# Patient Record
Sex: Female | Born: 1985 | Hispanic: Yes | Marital: Single | Smoking: Never smoker
Health system: Southern US, Community
[De-identification: ages and names within clinical notes are randomized; demographics above are authoritative.]

## PROBLEM LIST (undated history)

## (undated) DIAGNOSIS — E669 Obesity, unspecified: Secondary | ICD-10-CM

## (undated) DIAGNOSIS — E282 Polycystic ovarian syndrome: Secondary | ICD-10-CM

## (undated) DIAGNOSIS — Z98891 History of uterine scar from previous surgery: Secondary | ICD-10-CM

## (undated) DIAGNOSIS — T7840XA Allergy, unspecified, initial encounter: Secondary | ICD-10-CM

## (undated) DIAGNOSIS — IMO0002 Reserved for concepts with insufficient information to code with codable children: Secondary | ICD-10-CM

## (undated) DIAGNOSIS — F419 Anxiety disorder, unspecified: Secondary | ICD-10-CM

## (undated) DIAGNOSIS — R87619 Unspecified abnormal cytological findings in specimens from cervix uteri: Secondary | ICD-10-CM

## (undated) DIAGNOSIS — C801 Malignant (primary) neoplasm, unspecified: Secondary | ICD-10-CM

## (undated) DIAGNOSIS — J45909 Unspecified asthma, uncomplicated: Secondary | ICD-10-CM

## (undated) DIAGNOSIS — B977 Papillomavirus as the cause of diseases classified elsewhere: Secondary | ICD-10-CM

## (undated) HISTORY — DX: Unspecified abnormal cytological findings in specimens from cervix uteri: R87.619

## (undated) HISTORY — DX: Papillomavirus as the cause of diseases classified elsewhere: B97.7

## (undated) HISTORY — DX: Obesity, unspecified: E66.9

## (undated) HISTORY — DX: Polycystic ovarian syndrome: E28.2

## (undated) HISTORY — DX: Malignant (primary) neoplasm, unspecified: C80.1

## (undated) HISTORY — DX: Anxiety disorder, unspecified: F41.9

## (undated) HISTORY — DX: Allergy, unspecified, initial encounter: T78.40XA

## (undated) HISTORY — DX: Unspecified asthma, uncomplicated: J45.909

## (undated) HISTORY — DX: Reserved for concepts with insufficient information to code with codable children: IMO0002

---
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
## 2003-03-21 ENCOUNTER — Other Ambulatory Visit: Admission: RE | Admit: 2003-03-21 | Discharge: 2003-03-21 | Payer: Self-pay | Admitting: Obstetrics and Gynecology

## 2004-01-21 ENCOUNTER — Ambulatory Visit (HOSPITAL_COMMUNITY): Admission: RE | Admit: 2004-01-21 | Discharge: 2004-01-21 | Payer: Self-pay | Admitting: Unknown Physician Specialty

## 2004-07-16 ENCOUNTER — Emergency Department (HOSPITAL_COMMUNITY): Admission: EM | Admit: 2004-07-16 | Discharge: 2004-07-16 | Payer: Self-pay | Admitting: Emergency Medicine

## 2004-08-02 ENCOUNTER — Encounter: Payer: Self-pay | Admitting: Pulmonary Disease

## 2004-09-06 ENCOUNTER — Encounter: Payer: Self-pay | Admitting: Pulmonary Disease

## 2004-11-23 ENCOUNTER — Encounter: Payer: Self-pay | Admitting: Pulmonary Disease

## 2004-11-29 ENCOUNTER — Encounter: Payer: Self-pay | Admitting: Pulmonary Disease
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
## 2008-05-24 ENCOUNTER — Encounter: Payer: Self-pay | Admitting: Pulmonary Disease

## 2009-02-23 ENCOUNTER — Ambulatory Visit: Payer: Self-pay | Admitting: Pulmonary Disease

## 2009-02-23 DIAGNOSIS — J45909 Unspecified asthma, uncomplicated: Secondary | ICD-10-CM | POA: Insufficient documentation

## 2009-02-23 DIAGNOSIS — J309 Allergic rhinitis, unspecified: Secondary | ICD-10-CM | POA: Insufficient documentation

## 2009-02-23 DIAGNOSIS — R519 Headache, unspecified: Secondary | ICD-10-CM | POA: Insufficient documentation

## 2009-02-23 DIAGNOSIS — R51 Headache: Secondary | ICD-10-CM

## 2009-03-22 ENCOUNTER — Ambulatory Visit: Payer: Self-pay | Admitting: Pulmonary Disease

## 2009-03-24 ENCOUNTER — Ambulatory Visit: Payer: Self-pay | Admitting: Cardiovascular Disease

## 2009-03-29 ENCOUNTER — Encounter: Payer: Self-pay | Admitting: Pulmonary Disease

## 2009-03-30 LAB — CONVERTED CEMR LAB: IgE (Immunoglobulin E), Serum: 187.8 intl units/mL — ABNORMAL HIGH (ref 0.0–180.0)

## 2009-04-03 ENCOUNTER — Encounter: Payer: Self-pay | Admitting: Pulmonary Disease

## 2009-04-13 ENCOUNTER — Ambulatory Visit: Payer: Self-pay | Admitting: Pulmonary Disease

## 2009-04-22 HISTORY — PX: DILATION AND CURETTAGE OF UTERUS: SHX78

## 2011-04-23 DIAGNOSIS — C801 Malignant (primary) neoplasm, unspecified: Secondary | ICD-10-CM

## 2011-04-23 HISTORY — DX: Malignant (primary) neoplasm, unspecified: C80.1

## 2011-10-16 ENCOUNTER — Ambulatory Visit (INDEPENDENT_AMBULATORY_CARE_PROVIDER_SITE_OTHER): Payer: BC Managed Care – PPO | Admitting: Physician Assistant

## 2011-10-16 VITALS — BP 100/70 | HR 81 | Temp 98.6°F | Resp 16 | Ht 68.5 in | Wt 236.6 lb

## 2011-10-16 DIAGNOSIS — N898 Other specified noninflammatory disorders of vagina: Secondary | ICD-10-CM

## 2011-10-16 DIAGNOSIS — N926 Irregular menstruation, unspecified: Secondary | ICD-10-CM

## 2011-10-16 DIAGNOSIS — R300 Dysuria: Secondary | ICD-10-CM

## 2011-10-16 LAB — POCT UA - MICROSCOPIC ONLY
Casts, Ur, LPF, POC: NEGATIVE
Crystals, Ur, HPF, POC: NEGATIVE
Mucus, UA: POSITIVE
Yeast, UA: NEGATIVE

## 2011-10-16 LAB — POCT URINALYSIS DIPSTICK
Bilirubin, UA: NEGATIVE
Blood, UA: NEGATIVE
Glucose, UA: NEGATIVE
Ketones, UA: NEGATIVE
Leukocytes, UA: NEGATIVE
Nitrite, UA: NEGATIVE
Protein, UA: NEGATIVE
Spec Grav, UA: 1.02
Urobilinogen, UA: 0.2
pH, UA: 7.5

## 2011-10-16 LAB — POCT WET PREP WITH KOH
KOH Prep POC: NEGATIVE
Trichomonas, UA: NEGATIVE
WBC Wet Prep HPF POC: NEGATIVE
Yeast Wet Prep HPF POC: NEGATIVE

## 2011-10-16 LAB — POCT URINE PREGNANCY: Preg Test, Ur: NEGATIVE

## 2011-10-16 MED ORDER — METRONIDAZOLE 500 MG PO TABS: 500.0000 mg | ORAL_TABLET | Freq: Two times a day (BID) | ORAL | Status: AC

## 2011-10-16 MED ORDER — FLUCONAZOLE 150 MG PO TABS: 150.0000 mg | ORAL_TABLET | Freq: Once | ORAL | Status: AC

## 2011-10-16 NOTE — Progress Notes (Signed)
  Subjective:    Patient ID: Tracy Gutierrez, female    DOB: 02-24-1986, 26 y.o.   MRN: [id number]  HPI Patient presents with 2 week history of dysuria and vaginal irritation. Also complains of vaginal discharge with a slight odor. Admits to vaginal itching and some pain. Has noticed an increase in urinary frequency recently. Denies abdominal pain, fevers, chills, nausea, or vomiting. She also wishes to have a pregnancy test today. She just had a normal menstrual cycle 1 week ago but says she has been pregnant before and bled regularly until her miscarriage.   She is sexually active with 1 female partner. No specific concerns about STI's and had testing done 4 months ago with her annual pap.     Review of Systems  All other systems reviewed and are negative.       Objective:   Physical Exam  Constitutional: She is oriented to person, place, and time. She appears well-developed and well-nourished.  HENT:  Head: Normocephalic and atraumatic.  Right Ear: External ear normal.  Left Ear: External ear normal.  Eyes: Conjunctivae are normal.  Neck: Normal range of motion.  Cardiovascular: Normal rate, regular rhythm and normal heart sounds.   Pulmonary/Chest: Effort normal and breath sounds normal.  Genitourinary: Uterus normal. Pelvic exam was performed with patient supine. There is no rash or lesion on the right labia. There is no rash or lesion on the left labia. Cervix exhibits no motion tenderness and no friability. Right adnexum displays no tenderness and no fullness. Left adnexum displays no tenderness and no fullness. Vaginal discharge (thin, white discharge) found.  Neurological: She is alert and oriented to person, place, and time.  Psychiatric: She has a normal mood and affect. Her behavior is normal. Judgment and thought content normal.          Assessment & Plan:   1. Dysuria  Symptoms not likely from a urinary tract infection but instead from vaginal irritation. Will await  urine culture to determine if treatment is needed.  POCT UA - Microscopic Only, POCT urinalysis dipstick  2. Vaginal Discharge Based on patient symptoms and clinical findings will treat for BV. Await genprobe  POCT Wet Prep with KOH, GC/chlamydia probe amp, genital  3. Menstrual irregularity  POCT urine pregnancy

## 2011-10-17 LAB — GC/CHLAMYDIA PROBE AMP, GENITAL
Chlamydia, DNA Probe: NEGATIVE
GC Probe Amp, Genital: NEGATIVE

## 2011-10-18 LAB — URINE CULTURE: Colony Count: 50000

## 2012-01-24 ENCOUNTER — Ambulatory Visit (INDEPENDENT_AMBULATORY_CARE_PROVIDER_SITE_OTHER): Payer: BC Managed Care – PPO | Admitting: Family Medicine

## 2012-01-24 ENCOUNTER — Ambulatory Visit: Payer: BC Managed Care – PPO

## 2012-01-24 VITALS — BP 122/90 | HR 73 | Temp 98.8°F | Resp 16 | Ht 69.0 in | Wt 238.0 lb

## 2012-01-24 DIAGNOSIS — K219 Gastro-esophageal reflux disease without esophagitis: Secondary | ICD-10-CM

## 2012-01-24 DIAGNOSIS — R079 Chest pain, unspecified: Secondary | ICD-10-CM

## 2012-01-24 DIAGNOSIS — F411 Generalized anxiety disorder: Secondary | ICD-10-CM

## 2012-01-24 DIAGNOSIS — J45909 Unspecified asthma, uncomplicated: Secondary | ICD-10-CM

## 2012-01-24 DIAGNOSIS — F419 Anxiety disorder, unspecified: Secondary | ICD-10-CM

## 2012-01-24 DIAGNOSIS — R5383 Other fatigue: Secondary | ICD-10-CM

## 2012-01-24 LAB — POCT CBC
Granulocyte percent: 60.9 %G (ref 37–80)
HCT, POC: 42.5 % (ref 37.7–47.9)
Hemoglobin: 13.8 g/dL (ref 12.2–16.2)
Lymph, poc: 3.6 — AB (ref 0.6–3.4)
MCH, POC: 29.6 pg (ref 27–31.2)
MCHC: 32.5 g/dL (ref 31.8–35.4)
MCV: 91.1 fL (ref 80–97)
MID (cbc): 0.7 (ref 0–0.9)
MPV: 8.4 fL (ref 0–99.8)
POC Granulocyte: 6.6 (ref 2–6.9)
POC LYMPH PERCENT: 33 %L (ref 10–50)
POC MID %: 6.1 %M (ref 0–12)
Platelet Count, POC: 311 10*3/uL (ref 142–424)
RBC: 4.67 M/uL (ref 4.04–5.48)
RDW, POC: 13.1 %
WBC: 10.8 10*3/uL — AB (ref 4.6–10.2)

## 2012-01-24 LAB — BASIC METABOLIC PANEL
BUN: 8 mg/dL (ref 6–23)
CO2: 24 mEq/L (ref 19–32)
Calcium: 9.5 mg/dL (ref 8.4–10.5)
Chloride: 104 mEq/L (ref 96–112)
Creat: 0.58 mg/dL (ref 0.50–1.10)
Glucose, Bld: 81 mg/dL (ref 70–99)
Potassium: 4.7 mEq/L (ref 3.5–5.3)
Sodium: 138 mEq/L (ref 135–145)

## 2012-01-24 LAB — POCT URINE PREGNANCY: Preg Test, Ur: NEGATIVE

## 2012-01-24 LAB — TSH: TSH: 1.167 u[IU]/mL (ref 0.350–4.500)

## 2012-01-24 MED ORDER — RANITIDINE HCL 150 MG PO TABS: 150.0000 mg | ORAL_TABLET | Freq: Two times a day (BID) | ORAL | Status: DC

## 2012-01-24 MED ORDER — CLONAZEPAM 0.5 MG PO TABS: 0.5000 mg | ORAL_TABLET | Freq: Two times a day (BID) | ORAL | Status: DC | PRN

## 2012-01-24 MED ORDER — AZITHROMYCIN 250 MG PO TABS: ORAL_TABLET | ORAL | Status: DC

## 2012-01-24 NOTE — Progress Notes (Signed)
Subjective:    Patient ID: Tracy Gutierrez, female    DOB: 12-24-1985, 26 y.o.   MRN: [id number]  HPI Tracy Gutierrez is a 26 y.o. female Chest tightness when woke up this am at about 6 am.  Felt short of breath, some nausea, did have heartburn/reflux symptoms before going to sleep.  Still feeling same chest pain. Sleeping overnight due to noise from refrigerator.  Here with mother. Has been having more anxiety - more past few years.  No prior eval.  Has taken mother's xanax a few times - less than once per month.  Was feeling more stressed past 2 days.  Has had chest pains when stressed in past.  Tried albuterol this am - to see if it would help.  No relief.  Wheezing at times in morning, has PND and asthma.  Lightheaded at times. Fatigue. Every day. - past year. Hx of pneumonia - October of last year.  Few fevers - last week in 100 range.  No recent fever. Using albuterol once per day.   Nonsmoker, no illicit drug use.  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   LMP approx 9/14 or 9/15.  Sexually active, when asked if chance of pregnancy - stated didn't know.    Review of Systems As above.     Objective:   Physical Exam  Constitutional: She is oriented to person, place, and time. She appears well-developed and well-nourished. No distress.       overweight  HENT:  Head: Normocephalic and atraumatic.  Eyes: EOM are normal. Pupils are equal, round, and reactive to light.  Neck: Normal range of motion. Neck supple. No thyromegaly present.  Cardiovascular: Normal rate, regular rhythm, normal heart sounds and intact distal pulses.   Pulmonary/Chest: Effort normal. No accessory muscle usage. No respiratory distress. She has wheezes (few scattered expiratory wheezes.) in the right lower field and the left lower field.  Neurological: She is alert and oriented to person, place, and time.  Skin: Skin is warm and dry. She is not diaphoretic.  Psychiatric: She has a normal mood and affect. Her speech is  normal and behavior is normal. Thought content normal.       Flat affect, no distress.     [facility] reading (PRIMARY) by  Dr. Neva Seat: few increased RML markings without discrete infiltrate.  Results for orders placed in visit on 01/24/12  POCT CBC      Component Value Range   WBC 10.8 (*) 4.6 - 10.2 K/uL   Lymph, poc 3.6 (*) 0.6 - 3.4   POC LYMPH PERCENT 33.0  10 - 50 %L   MID (cbc) 0.7  0 - 0.9   POC MID % 6.1  0 - 12 %M   POC Granulocyte 6.6  2 - 6.9   Granulocyte percent 60.9  37 - 80 %G   RBC 4.67  4.04 - 5.48 M/uL   Hemoglobin 13.8  12.2 - 16.2 g/dL   HCT, POC 78.2  95.6 - 47.9 %   MCV 91.1  80 - 97 fL   MCH, POC 29.6  27 - 31.2 pg   MCHC 32.5  31.8 - 35.4 g/dL   RDW, POC 21.3     Platelet Count, POC 311  142 - 424 K/uL   MPV 8.4  0 - 99.8 fL  POCT URINE PREGNANCY      Component Value Range   Preg Test, Ur Negative     EKG: sr, no acute findings.     Assessment &  Plan:  Tracy Gutierrez is a 26 y.o. female 1. Chest pain  POCT CBC, POCT urine pregnancy, TSH, Basic metabolic panel, EKG 12-Lead, DG Chest 2 View, azithromycin (ZITHROMAX) 250 MG tablet, ranitidine (ZANTAC) 150 MG tablet  2. GERD (gastroesophageal reflux disease)  POCT CBC, TSH, Basic metabolic panel, EKG 12-Lead, ranitidine (ZANTAC) 150 MG tablet  3. Fatigue  POCT urine pregnancy  4. Asthmatic bronchitis  azithromycin (ZITHROMAX) 250 MG tablet  5. Anxiety  clonazePAM (KLONOPIN) 0.5 MG tablet    Chest pain - multifactorial? Borderline leukocytosis CAP vs asthmatic bronchitis, Reflux and anxiety component. Start z pak, increase albuterol to Q 6h prn, zantac 150mg  BID, and recheck in 4 days - sooner if fever or worsening - may need prednisone taper. rtc precautions.   Anxiety - stress mgt techniques and counselled for approx 5 minutes - check tsh and recheck in 4 days to discuss further.  Fatigue - stress component likely. Check BMP, tsh.  rtc precautions.  Patient Instructions  Recheck in 4 days.  Sooner if  any worsening.  Start zantac and zpak,  Can use albuterol up to every 4 to 6 hours if needed. Work on Medical illustrator discussed, and can purchase book "Anxiety for Dummies" for more information.  We can discuss this further at next office visit. Return to the clinic or go to the nearest emergency room if any of your symptoms worsen or new symptoms occur.

## 2012-01-24 NOTE — Patient Instructions (Signed)
Recheck in 4 days.  Sooner if any worsening.  Start zantac and zpak,  Can use albuterol up to every 4 to 6 hours if needed. Work on Medical illustrator discussed, and can purchase book "Anxiety for Dummies" for more information.  We can discuss this further at next office visit. Return to the clinic or go to the nearest emergency room if any of your symptoms worsen or new symptoms occur.

## 2012-03-28 ENCOUNTER — Ambulatory Visit (INDEPENDENT_AMBULATORY_CARE_PROVIDER_SITE_OTHER): Payer: BC Managed Care – PPO | Admitting: Family Medicine

## 2012-03-28 ENCOUNTER — Encounter: Payer: Self-pay | Admitting: Family Medicine

## 2012-03-28 VITALS — BP 117/78 | HR 88 | Temp 98.6°F | Resp 16 | Ht 68.0 in | Wt 239.4 lb

## 2012-03-28 DIAGNOSIS — M546 Pain in thoracic spine: Secondary | ICD-10-CM

## 2012-03-28 MED ORDER — CYCLOBENZAPRINE HCL 10 MG PO TABS: ORAL_TABLET | ORAL | Status: DC

## 2012-03-28 MED ORDER — PREDNISONE 20 MG PO TABS: ORAL_TABLET | ORAL | Status: DC

## 2012-03-28 NOTE — Progress Notes (Signed)
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 She's had this pain for about 2 weeks and it's getting worse. She feels like if she could just move the right away she to pop it and the pain would go away. Patient's past medical history consists of asthma. She does not smoke.  Objective: Patient has point tenderness on the fifth proximal rib on the right between the vertebral column and the scapula. She has mild scoliosis. Lungs sounds reveal diffuse expiratory wheezing.  Overlying skin is unremarkable.  Patient is in no acute distress.  Assessment: Rib subluxation syndrome  Plan: 1. Thoracic back pain  predniSONE (DELTASONE) 20 MG tablet, cyclobenzaprine (FLEXERIL) 10 MG tablet

## 2012-03-30 ENCOUNTER — Telehealth: Payer: Self-pay

## 2012-03-30 NOTE — Telephone Encounter (Signed)
PT STATES SHE WAS SEEN AND THE DR REALLY DIDN'T DO ANYTHING FOR HER. SHE WANTED AN XRAY BUT HE REFUSED AND SHE IS STILL IN A LOT OF PAIN WOULD LIKE TO BE REFERRED TO A SPECIALIST ASAP. PLEASE CALL [phone]

## 2012-03-30 NOTE — Telephone Encounter (Signed)
Please advise on referral for patient.

## 2012-03-31 ENCOUNTER — Other Ambulatory Visit: Payer: Self-pay | Admitting: Family Medicine

## 2012-03-31 DIAGNOSIS — M549 Dorsalgia, unspecified: Secondary | ICD-10-CM

## 2012-03-31 NOTE — Telephone Encounter (Signed)
Notified pt that Rx was sent in and referral has been started to ortho, and advised pt that if she worsens we'll be glad to see her back before her ortho appt if needed. Pt thanked [place].

## 2012-03-31 NOTE — Telephone Encounter (Signed)
Please call in Tramadol 50 mg #15 one every 6h prn I have referred patient to orthopedics

## 2012-04-01 ENCOUNTER — Other Ambulatory Visit: Payer: Self-pay

## 2012-04-01 MED ORDER — TRAMADOL HCL 50 MG PO TABS: 50.0000 mg | ORAL_TABLET | Freq: Four times a day (QID) | ORAL | Status: DC | PRN

## 2012-07-15 ENCOUNTER — Other Ambulatory Visit: Payer: Self-pay | Admitting: *Deleted

## 2012-07-15 ENCOUNTER — Ambulatory Visit (INDEPENDENT_AMBULATORY_CARE_PROVIDER_SITE_OTHER): Payer: BC Managed Care – PPO | Admitting: Family Medicine

## 2012-07-15 VITALS — BP 100/78 | HR 75 | Temp 98.8°F | Resp 16 | Ht 68.5 in | Wt 242.0 lb

## 2012-07-15 DIAGNOSIS — K602 Anal fissure, unspecified: Secondary | ICD-10-CM

## 2012-07-15 DIAGNOSIS — K625 Hemorrhage of anus and rectum: Secondary | ICD-10-CM

## 2012-07-15 MED ORDER — DILTIAZEM GEL 2 %: 1.0000 "application " | Freq: Two times a day (BID) | CUTANEOUS | Status: DC

## 2012-07-15 MED ORDER — LIDOCAINE HCL 2 % EX GEL: CUTANEOUS | Status: DC | PRN

## 2012-07-15 NOTE — Patient Instructions (Signed)
Continue sitz baths, xylocaine and diltiazem topicals   Anal Fissure, Adult An anal fissure is a small tear or crack in the skin around the anus. Bleeding from a fissure usually stops on its own within a few minutes. However, bleeding will often reoccur with each bowel movement until the crack heals.  CAUSES   Passing large, hard stools.  Frequent diarrheal stools.  Constipation.  Inflammatory bowel disease (Crohn's disease or ulcerative colitis).  Infections.  Anal sex. SYMPTOMS   Small amounts of blood seen on your stools, on toilet paper, or in the toilet after a bowel movement.  Rectal bleeding.  Painful bowel movements.  Itching or irritation around the anus. DIAGNOSIS Your caregiver will examine the anal area. An anal fissure can usually be seen with careful inspection. A rectal exam may be performed and a short tube (anoscope) may be used to examine the anal canal. TREATMENT   You may be instructed to take fiber supplements. These supplements can soften your stool to help make bowel movements easier.  Sitz baths may be recommended to help heal the tear. Do not use soap in the sitz baths.  A medicated cream or ointment may be prescribed to lessen discomfort. HOME CARE INSTRUCTIONS   Maintain a diet high in fruits, whole grains, and vegetables. Avoid constipating foods like bananas and dairy products.  Take sitz baths as directed by your caregiver.  Drink enough fluids to keep your urine clear or pale yellow.  Only take over-the-counter or prescription medicines for pain, discomfort, or fever as directed by your caregiver. Do not take aspirin as this may increase bleeding.  Do not use ointments containing numbing medications (anesthetics) or hydrocortisone. They could slow healing. SEEK MEDICAL CARE IF:   Your fissure is not completely healed within 3 days.  You have further bleeding.  You have a fever.  You have diarrhea mixed with blood.  You have  pain.  Your problem is getting worse rather than better. MAKE SURE YOU:   Understand these instructions.  Will watch your condition.  Will get help right away if you are not doing well or get worse. Document Released: 04/08/2005 Document Revised: 07/01/2011 Document Reviewed: 09/23/2010 [facility] Patient Information 2013 [place], [place].

## 2012-07-15 NOTE — Progress Notes (Signed)
27 yo pregnant (2 months) with 1 month of hemorrhoids, painful and bleeding.  No constipation, using miralax.  No fever.  Using triamcinolone cream and preparation H without benefit Had first OB visit about a month ago.  LMP:  January 16th G2P0, spont ab 1 Patient was diagnosed with lung cancer last December!  Objective:  NAD Large left sided anal fissure with swollen skin tag  Assessment:  Anal fissure  Plan:  Diltiazem and xylocaine creams Sitz baths

## 2012-07-21 HISTORY — PX: LUNG SURGERY: SHX703

## 2012-07-31 LAB — OB RESULTS CONSOLE GC/CHLAMYDIA
Chlamydia: NEGATIVE
Gonorrhea: NEGATIVE

## 2012-07-31 LAB — OB RESULTS CONSOLE ANTIBODY SCREEN: Antibody Screen: NEGATIVE

## 2012-07-31 LAB — OB RESULTS CONSOLE ABO/RH: RH Type: POSITIVE

## 2012-07-31 LAB — OB RESULTS CONSOLE HEPATITIS B SURFACE ANTIGEN: Hepatitis B Surface Ag: NEGATIVE

## 2012-09-20 HISTORY — PX: OTHER SURGICAL HISTORY: SHX169

## 2012-11-20 ENCOUNTER — Other Ambulatory Visit: Payer: Self-pay

## 2012-12-01 ENCOUNTER — Encounter (HOSPITAL_COMMUNITY): Payer: Self-pay

## 2012-12-01 ENCOUNTER — Ambulatory Visit (HOSPITAL_COMMUNITY)
Admission: RE | Admit: 2012-12-01 | Discharge: 2012-12-01 | Disposition: A | Discharge status: Home / Self Care | Payer: BC Managed Care – PPO | Source: Ambulatory Visit | Attending: Obstetrics & Gynecology | Admitting: Obstetrics & Gynecology

## 2012-12-01 VITALS — BP 121/74 | HR 106 | Wt 239.0 lb

## 2012-12-01 DIAGNOSIS — C7A8 Other malignant neuroendocrine tumors: Secondary | ICD-10-CM

## 2012-12-01 NOTE — Consult Note (Signed)
MFM consult  27 yr old G2P0010 at [redacted]w[redacted]d referred by Dr. Juliene Pina for consultation regarding left pulmonary neuroendocrine tumor. Patient reports she overall feels well. Continues to have some shortness of breath and decreased appetite but is eating. No contractions, leaking of fluid, or vaginal bleeding. Positive fetal movement.  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  I counseled her as follows:  1. Pulmonary neuroendocrine tumor: - patient is followed by CT surgery and Oncology at [facility] - I have sent a message to her surgeon to inquire if there is any contraindication to vaginal delivery - I do not feel that there is any contraindication to vaginal delivery and C section should be reserved for routine obstetrical indications; if her surgeons feel otherwise I will contact your office - based on their reports it does not seem that any chemotherapy or further treatment is indicated at this time aside from close follow up with repeat CT scan after delivery; therefore I would not alter delivery timing and allow patient to go in to spontaneous labor - I would recommend fetal growth surveillance every 4-6 weeks given surgery; pneumonectomy, and poor weight gain this pregnancy - Would recommend an consult with Anesthesia prior to delivery so they may review her case - otherwise I would proceed with routine obstetric care  I spent 45 minutes in face to face consultation with the patient.  If you have any questions please call.  Eulis Foster, MD

## 2013-01-08 LAB — OB RESULTS CONSOLE GBS: GBS: POSITIVE

## 2013-02-01 ENCOUNTER — Inpatient Hospital Stay (HOSPITAL_COMMUNITY): Admission: AD | Admit: 2013-02-01 | Payer: Self-pay | Source: Ambulatory Visit | Admitting: Obstetrics & Gynecology

## 2013-02-04 ENCOUNTER — Other Ambulatory Visit: Payer: Self-pay | Admitting: Obstetrics & Gynecology

## 2013-02-08 ENCOUNTER — Telehealth (HOSPITAL_COMMUNITY): Payer: Self-pay | Admitting: *Deleted

## 2013-02-08 ENCOUNTER — Encounter (HOSPITAL_COMMUNITY): Payer: Self-pay | Admitting: *Deleted

## 2013-02-08 NOTE — Telephone Encounter (Signed)
Preadmission screen  

## 2013-02-09 ENCOUNTER — Ambulatory Visit (INDEPENDENT_AMBULATORY_CARE_PROVIDER_SITE_OTHER): Payer: BC Managed Care – PPO | Admitting: Pulmonary Disease

## 2013-02-09 ENCOUNTER — Encounter: Payer: Self-pay | Admitting: Pulmonary Disease

## 2013-02-09 ENCOUNTER — Telehealth: Payer: Self-pay

## 2013-02-09 ENCOUNTER — Ambulatory Visit (HOSPITAL_COMMUNITY)
Admission: RE | Admit: 2013-02-09 | Discharge: 2013-02-09 | Disposition: A | Discharge status: Home / Self Care | Payer: BC Managed Care – PPO | Source: Ambulatory Visit | Attending: Obstetrics & Gynecology | Admitting: Obstetrics & Gynecology

## 2013-02-09 VITALS — BP 124/72 | HR 100 | Temp 98.6°F | Ht 68.5 in | Wt 253.0 lb

## 2013-02-09 DIAGNOSIS — Z902 Acquired absence of lung [part of]: Secondary | ICD-10-CM | POA: Insufficient documentation

## 2013-02-09 DIAGNOSIS — J45909 Unspecified asthma, uncomplicated: Secondary | ICD-10-CM

## 2013-02-09 DIAGNOSIS — Z331 Pregnant state, incidental: Secondary | ICD-10-CM | POA: Insufficient documentation

## 2013-02-09 DIAGNOSIS — J988 Other specified respiratory disorders: Secondary | ICD-10-CM | POA: Insufficient documentation

## 2013-02-09 MED ORDER — ALBUTEROL SULFATE (5 MG/ML) 0.5% IN NEBU
2.5000 mg | INHALATION_SOLUTION | Freq: Once | RESPIRATORY_TRACT | Status: AC
  Administered 2013-02-09: 2.5 mg via RESPIRATORY_TRACT

## 2013-02-09 NOTE — Patient Instructions (Signed)
Can proceed with induction under spinal anesthesia OK to stay off advair Lung function is OK Use albuterol nebs if wheezing noted during/after labor Good luck

## 2013-02-09 NOTE — Assessment & Plan Note (Addendum)
Can proceed with induction under spinal anesthesia OK to stay off advair -doubt the diagnosis of asthma here since her symptoms improved after surgery Lung function is OK -as would be expected after pneumonectomy Use albuterol nebs if wheezing noted during/after labor

## 2013-02-09 NOTE — Progress Notes (Signed)
Subjective:    Patient ID: Tracy Gutierrez, female    DOB: [birth year], 27 y.o.   MRN: [id number]  HPI 27 year old never smoker, [redacted] weeks pregnant referred by anesthesia for pulmonary clearance prior to induction.  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 She presented with recurrent left-sided pneumonias and was eventually diagnosed with lung cancer and underwent pneumonectomy at [facility] in April 2014. The details are not available to me at the time of dictation. Because of the pregnancy, she was not evaluated for chemotherapy or radiation postop. I do note a history of asthma in the past and she hass seen Dr. Shelle Iron last in December 2010 Prior records from ent and allergy were reviewed Treated for otitis by Dr. 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 Allergy testing positive primarily for dust mite alone. RAST 2006 showed IgE 142, and 2 allergens with significant abnl. She was maintained on Advair but after the pneumonectomy, all her lower airways symptoms symptoms resolved. She has not required albuterol inhaler much. Pulmonary function testing does not show evidence of airway obstruction. There is moderate restriction with FVC of 55% , ratio 86 as would be expected after pneumonectomy. She is in a wheelchair today and induction is planned 2 day's from now.  Past Medical History  Diagnosis Date  . Allergy   . Anxiety   . Asthma   . PCOS (polycystic ovarian syndrome)   . 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 Obesity, unspecified   . Abnormal Pap smear   . 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 Dilation and curettage of uterus  2011  . Lung surgery  07/2012    left  . Anal sphincter tear  09/2012   No Known Allergies  History   Social History  . Marital Status:  Single    Spouse Name: N/A    Number of Children: N/A  . Years of Education: N/A   Occupational History  . Not on file.   Social History Main Topics  . Smoking status: Never Smoker   . Smokeless tobacco: Never Used  . Alcohol Use: No  . Drug Use: No  . Sexual Activity: Yes    Birth Control/ Protection: None   Other Topics Concern  . Not on file   Social History Narrative  . No narrative on file    Family History  Problem Relation Age of Onset  . Diabetes Mother   . Heart disease Mother   . Hypertension Mother   . Pancreatitis Mother   . Thyroid disease Mother   . Hypertension Father   . Diabetes Maternal Grandmother   . Hyperlipidemia Maternal Grandmother   . Hypertension Maternal Grandmother   . Breast cancer Maternal Grandmother   . Hyperlipidemia Paternal Grandmother   . Hypertension Paternal Grandmother   . Diabetes Paternal Grandfather   . Heart disease Paternal Grandfather      Review of Systems  Constitutional: Negative for fever and unexpected weight change.  HENT: Negative for congestion, dental problem, ear pain, nosebleeds, postnasal drip, rhinorrhea, sinus pressure, sneezing, sore throat and trouble swallowing.   Eyes: Negative for redness and itching.  Respiratory: Negative for cough, chest tightness, shortness of breath and wheezing.   Cardiovascular: Negative for palpitations and leg swelling.  Gastrointestinal: Negative for nausea and vomiting.  Genitourinary:  Negative for dysuria.  Musculoskeletal: Negative for joint swelling.  Skin: Negative for rash.  Neurological: Negative for headaches.  Hematological: Does not bruise/bleed easily.  Psychiatric/Behavioral: Negative for dysphoric mood. The patient is not nervous/anxious.        Objective:   Physical Exam  Gen. Pleasant, well-nourished, in wheelchair ,in no distress, normal affect ENT - no lesions, no post nasal drip Neck: No JVD, no thyromegaly, no carotid bruits Lungs: no use of  accessory muscles, no dullness to percussion, decreased left without rales or rhonchi  Cardiovascular: Rhythm regular, heart sounds  normal, no murmurs or gallops, 1+ peripheral edema Abdomen: [redacted] weeks pregnant , soft and non-tender, no hepatosplenomegaly, BS normal. Musculoskeletal: No deformities, no cyanosis or clubbing Neuro:  alert, non focal        Assessment & Plan:

## 2013-02-09 NOTE — Telephone Encounter (Signed)
Spoke with Sue Lush.  Was advised pt can go to [facility] office today if needed and Dr. Juliene Pina will order PFTs.  Per Sue Lush, this is needed per anesthesiologist prior to induction scheduled for Thursday as pt's had lung surgery in the past.   Called Resp Dept, spoke with Marcelino Duster.  PFT can be done today at 1 pm at [facility].  Order will need to be faxed to [phone].    Called Sue Lush back.  Advised of above per [facility].  She verbalized understanding and is aware to have pt go to [facility] office after PFT for Consult with RA at 3 pm.  Asked pt arrive to [facility] at 2:45 pm and bring all meds to visit.  Sue Lush was given [facility] office address and phone #.  She willl inform pt of above appts and locations and will call back if anything further is needed.  ** Note:  Sue Lush does not believe an interpreter is needed.  If she finds out this is needed, she will arrange for this.    Also, notes will need to be faxed to [phone].  Will sign off and route to Mindy as FYI.

## 2013-02-09 NOTE — Telephone Encounter (Signed)
[email or web address] for andrea. Is Dr. Juliene Pina going to order the PFT? Depending on the time Sue Lush calls back, Can pt go to the [facility] office today IF PFT can be done prior at hospital?

## 2013-02-10 DIAGNOSIS — Z902 Acquired absence of lung [part of]: Secondary | ICD-10-CM | POA: Insufficient documentation

## 2013-02-10 NOTE — Assessment & Plan Note (Signed)
For lung cancer 06/2012 Await details from [facility], may need surveillance CT and perhaps referral to oncology after delivery

## 2013-02-10 NOTE — Anesthesia Preprocedure Evaluation (Addendum)
Anesthesia Evaluation  Patient identified by MRN, date of birth, ID band Patient awake    Reviewed: Allergy & Precautions, H&P , Patient's Chart, lab work & pertinent test results  Airway Mallampati: III TM Distance: >3 FB Neck ROM: full    Dental no notable dental hx.    Pulmonary neg pulmonary ROS, asthma ,  breath sounds clear to auscultation  Pulmonary exam normal       Cardiovascular negative cardio ROS  Rhythm:regular Rate:Normal     Neuro/Psych  Headaches, Anxiety negative neurological ROS  negative psych ROS   GI/Hepatic negative GI ROS, Neg liver ROS,   Endo/Other  negative endocrine ROS  Renal/GU negative Renal ROS     Musculoskeletal   Abdominal   Peds  Hematology negative hematology ROS (+)   Anesthesia Other Findings S/p pneumonectomy  Reproductive/Obstetrics (+) Pregnancy                           Anesthesia Physical Anesthesia Plan  ASA: III and emergent  Anesthesia Plan: Epidural   Post-op Pain Management:    Induction:   Airway Management Planned: Natural Airway and Nasal Cannula  Additional Equipment:   Intra-op Plan:   Post-operative Plan:   Informed Consent: I have reviewed the patients History and Physical, chart, labs and discussed the procedure including the risks, benefits and alternatives for the proposed anesthesia with the patient or authorized representative who has indicated his/her understanding and acceptance.   Dental advisory given  Plan Discussed with: Anesthesiologist, CRNA and Surgeon  Anesthesia Plan Comments:        Anesthesia Quick Evaluation Spoke to Dr. Tildon Husky about this lady who is scheduled for induction s/p pneumonectomy.  I asked for Dr. Tildon Husky to get a pulmonology consult to ensure that the patient's post op pulmonary function is adequate to handle the increased oxygen demand as well as to tolerate spinal or epidural anesthesia.   PFTs were still pending at the time of this note.  MFM feels confident that the patient is appropriate for delivery at this location (see note).

## 2013-02-11 ENCOUNTER — Inpatient Hospital Stay (HOSPITAL_COMMUNITY): Payer: BC Managed Care – PPO | Admitting: Anesthesiology

## 2013-02-11 ENCOUNTER — Encounter (HOSPITAL_COMMUNITY): Payer: Self-pay

## 2013-02-11 ENCOUNTER — Inpatient Hospital Stay (HOSPITAL_COMMUNITY)
Admission: RE | Admit: 2013-02-11 | Discharge: 2013-02-14 | DRG: 765 | Disposition: A | Discharge status: Home / Self Care | Payer: BC Managed Care – PPO | Source: Ambulatory Visit | Attending: Obstetrics & Gynecology | Admitting: Obstetrics & Gynecology

## 2013-02-11 ENCOUNTER — Encounter (HOSPITAL_COMMUNITY): Payer: BC Managed Care – PPO | Admitting: Anesthesiology

## 2013-02-11 DIAGNOSIS — O9903 Anemia complicating the puerperium: Secondary | ICD-10-CM | POA: Diagnosis not present

## 2013-02-11 DIAGNOSIS — O99892 Other specified diseases and conditions complicating childbirth: Secondary | ICD-10-CM | POA: Diagnosis present

## 2013-02-11 DIAGNOSIS — D62 Acute posthemorrhagic anemia: Secondary | ICD-10-CM | POA: Diagnosis not present

## 2013-02-11 DIAGNOSIS — Z859 Personal history of malignant neoplasm, unspecified: Secondary | ICD-10-CM

## 2013-02-11 DIAGNOSIS — Z902 Acquired absence of lung [part of]: Secondary | ICD-10-CM

## 2013-02-11 DIAGNOSIS — Z22330 Carrier of Group B streptococcus: Secondary | ICD-10-CM

## 2013-02-11 DIAGNOSIS — O3660X0 Maternal care for excessive fetal growth, unspecified trimester, not applicable or unspecified: Secondary | ICD-10-CM | POA: Diagnosis present

## 2013-02-11 DIAGNOSIS — Z98891 History of uterine scar from previous surgery: Secondary | ICD-10-CM

## 2013-02-11 HISTORY — DX: History of uterine scar from previous surgery: Z98.891

## 2013-02-11 LAB — CBC
HCT: 31.4 % — ABNORMAL LOW (ref 36.0–46.0)
MCH: 28.3 pg (ref 26.0–34.0)
MCHC: 34.4 g/dL (ref 30.0–36.0)
MCV: 82.2 fL (ref 78.0–100.0)
Platelets: 196 10*3/uL (ref 150–400)
RBC: 3.82 MIL/uL — ABNORMAL LOW (ref 3.87–5.11)
RDW: 15.3 % (ref 11.5–15.5)

## 2013-02-11 LAB — TYPE AND SCREEN: Antibody Screen: NEGATIVE

## 2013-02-11 MED ORDER — DIPHENHYDRAMINE HCL 50 MG/ML IJ SOLN: 12.5000 mg | INTRAMUSCULAR | Status: DC | PRN

## 2013-02-11 MED ORDER — LIDOCAINE HCL (PF) 1 % IJ SOLN: 30.0000 mL | INTRAMUSCULAR | Status: DC | PRN

## 2013-02-11 MED ORDER — IBUPROFEN 600 MG PO TABS: 600.0000 mg | ORAL_TABLET | Freq: Four times a day (QID) | ORAL | Status: DC | PRN

## 2013-02-11 MED ORDER — EPHEDRINE 5 MG/ML INJ
10.0000 mg | INTRAVENOUS | Status: DC | PRN
  Filled 2013-02-11: qty 4

## 2013-02-11 MED ORDER — OXYCODONE-ACETAMINOPHEN 5-325 MG PO TABS: 1.0000 | ORAL_TABLET | ORAL | Status: DC | PRN

## 2013-02-11 MED ORDER — LIDOCAINE HCL (PF) 1 % IJ SOLN
INTRAMUSCULAR | Status: DC | PRN
  Administered 2013-02-11 (×2): 5 mL

## 2013-02-11 MED ORDER — OXYTOCIN 40 UNITS IN LACTATED RINGERS INFUSION - SIMPLE MED: 62.5000 mL/h | INTRAVENOUS | Status: DC

## 2013-02-11 MED ORDER — TERBUTALINE SULFATE 1 MG/ML IJ SOLN: 0.2500 mg | Freq: Once | INTRAMUSCULAR | Status: AC | PRN

## 2013-02-11 MED ORDER — FENTANYL 2.5 MCG/ML BUPIVACAINE 1/10 % EPIDURAL INFUSION (WH - ANES)
14.0000 mL/h | INTRAMUSCULAR | Status: DC | PRN
  Administered 2013-02-11: 14 mL/h via EPIDURAL
  Filled 2013-02-11: qty 125

## 2013-02-11 MED ORDER — LACTATED RINGERS IV SOLN: 500.0000 mL | Freq: Once | INTRAVENOUS | Status: DC

## 2013-02-11 MED ORDER — PENICILLIN G POTASSIUM 5000000 UNITS IJ SOLR
5.0000 10*6.[IU] | Freq: Once | INTRAVENOUS | Status: AC
  Administered 2013-02-11: 5 10*6.[IU] via INTRAVENOUS
  Filled 2013-02-11: qty 5

## 2013-02-11 MED ORDER — LACTATED RINGERS IV SOLN: 500.0000 mL | INTRAVENOUS | Status: DC | PRN

## 2013-02-11 MED ORDER — ONDANSETRON HCL 4 MG/2ML IJ SOLN: 4.0000 mg | Freq: Four times a day (QID) | INTRAMUSCULAR | Status: DC | PRN

## 2013-02-11 MED ORDER — OXYTOCIN BOLUS FROM INFUSION: 500.0000 mL | INTRAVENOUS | Status: DC

## 2013-02-11 MED ORDER — CITRIC ACID-SODIUM CITRATE 334-500 MG/5ML PO SOLN
30.0000 mL | ORAL | Status: DC | PRN
  Administered 2013-02-12: 30 mL via ORAL
  Filled 2013-02-11: qty 15

## 2013-02-11 MED ORDER — PHENYLEPHRINE 40 MCG/ML (10ML) SYRINGE FOR IV PUSH (FOR BLOOD PRESSURE SUPPORT)
80.0000 ug | PREFILLED_SYRINGE | INTRAVENOUS | Status: DC | PRN
  Filled 2013-02-11: qty 5

## 2013-02-11 MED ORDER — FLEET ENEMA 7-19 GM/118ML RE ENEM: 1.0000 | ENEMA | RECTAL | Status: DC | PRN

## 2013-02-11 MED ORDER — FENTANYL CITRATE 0.05 MG/ML IJ SOLN
INTRAMUSCULAR | Status: DC
  Administered 2013-02-11 – 2013-02-12 (×2): via EPIDURAL
  Filled 2013-02-11 (×5): qty 12.5

## 2013-02-11 MED ORDER — LACTATED RINGERS IV SOLN
INTRAVENOUS | Status: DC
  Administered 2013-02-11 (×2): via INTRAVENOUS

## 2013-02-11 MED ORDER — PENICILLIN G POTASSIUM 5000000 UNITS IJ SOLR
2.5000 10*6.[IU] | INTRAVENOUS | Status: DC
  Administered 2013-02-11 (×4): 2.5 10*6.[IU] via INTRAVENOUS
  Filled 2013-02-11 (×8): qty 2.5

## 2013-02-11 MED ORDER — PHENYLEPHRINE 40 MCG/ML (10ML) SYRINGE FOR IV PUSH (FOR BLOOD PRESSURE SUPPORT): 80.0000 ug | PREFILLED_SYRINGE | INTRAVENOUS | Status: DC | PRN

## 2013-02-11 MED ORDER — EPHEDRINE 5 MG/ML INJ: 10.0000 mg | INTRAVENOUS | Status: DC | PRN

## 2013-02-11 MED ORDER — BUPIVACAINE HCL (PF) 0.25 % IJ SOLN
INTRAMUSCULAR | Status: DC | PRN
  Administered 2013-02-11: 5 mL via EPIDURAL

## 2013-02-11 MED ORDER — ACETAMINOPHEN 325 MG PO TABS
650.0000 mg | ORAL_TABLET | ORAL | Status: DC | PRN
  Administered 2013-02-11: 650 mg via ORAL
  Filled 2013-02-11: qty 2

## 2013-02-11 MED ORDER — OXYTOCIN 40 UNITS IN LACTATED RINGERS INFUSION - SIMPLE MED
1.0000 m[IU]/min | INTRAVENOUS | Status: DC
  Administered 2013-02-11: 2 m[IU]/min via INTRAVENOUS
  Administered 2013-02-11: 4 m[IU]/min via INTRAVENOUS
  Filled 2013-02-11: qty 1000

## 2013-02-11 NOTE — Progress Notes (Signed)
I offered initial support and introductions to [place] and her family.  They did not have any immediate needs at the time but are aware of on-going chaplain availability.  [employer] Pager, [phone] 12:15 PM   02/11/13 1200  Clinical Encounter Type  Visited With Patient and family together  Visit Type Initial

## 2013-02-11 NOTE — H&P (Signed)
Tracy Gutierrez is a 27 y.o. female G2P0 at 40.2 wks admitted for labor IOL due to maternal discomfort.  Patient conceived spontaneously after PCOS, infertility and one SAB. She has SOB in 1st trimester and Asthma symptoms, was diagnosed with lung tumor around 6-10 wks and underwent Left  pneumonectomy at [facility] at 14 wks. Surgery and post-op was uncomplicated. She does not need chemo or radiation. She saw MFM in pregnancy and was not recommended [facility] transfer since she was stable, with normal O2 sats, ambulating and without any cardio-pulmonary compromise.  She had thrombosed hemorrhoid in pregnancy and underwent excision in pregnancy at [facility], has a small fissure. She had intese itching- skin tinea in pregnancy that resolved with Diflucan course. Fatigue, Anemia improved with high protein diet, Iron.  Ob labs and sonograms normal - Anatomy and interval growth sono normal. Last sono 8'9" at 93%, vtx.   History OB History   Grav Para Term Preterm Abortions TAB SAB Ect Mult Living   2    1  1         Past Medical History  Diagnosis Date  . Allergy   . Anxiety   . Asthma   . PCOS (polycystic ovarian syndrome)   . 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 Obesity, unspecified   . Abnormal Pap smear   . 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 Dilation and curettage of uterus  2011  . Lung surgery  07/2012    left  . Anal sphincter tear  09/2012   Family History: family history includes Breast cancer in her maternal grandmother; Diabetes in her maternal grandmother, mother, and paternal grandfather; Heart disease in her mother and paternal grandfather; Hyperlipidemia in her maternal grandmother and paternal grandmother; Hypertension in her father, maternal grandmother, mother, and paternal grandmother; Pancreatitis in her mother; Thyroid disease in her mother. Social History:  reports that  she has never smoked. She has never used smokeless tobacco. She reports that she does not drink alcohol or use illicit drugs.   Prenatal Transfer Tool  Maternal Diabetes: No Genetic Screening: Normal Maternal Ultrasounds/Referrals: Normal Fetal Ultrasounds or other Referrals:  None Maternal Substance Abuse:  No Significant Maternal Medications:  None Significant Maternal Lab Results:  Lab values include: Group B Strep positive Other Comments:  Maternal pneumonectomy for neuroendocrine tumor at 14 wks.   Review of Systems  Constitutional: Negative for fever.  Eyes: Negative for blurred vision.  Respiratory: Negative for cough and shortness of breath.   Cardiovascular: Negative for chest pain.  Gastrointestinal: Negative for heartburn.  Genitourinary: Negative for dysuria.  Musculoskeletal: Negative for myalgias.  Neurological: Negative for dizziness and headaches.  Psychiatric/Behavioral: Negative for depression.    Blood pressure 121/70, pulse 95, temperature 98.3 F (36.8 C), temperature source Oral, resp. rate 20, height 5' 8.5" (1.74 m), weight 254 lb (115.214 kg), last menstrual period 05/05/2012, SpO2 100.00%. Exam Physical Exam  A&O x 3, no acute distress. Pleasant HEENT neg, no thyromegaly Lungs CTA bilat CV RRR, S1S2 normal Abdo soft, non tender, non acute Extr no edema/ tenderness Pelvic 2/90%/-2-3/ Vtx, AROM, clear fluid.  FHT  130s/ + accels/ no decels/ mod variab- reassuring. Toco q 2-3 min.   Prenatal labs: ABO, Rh: --/--/A POS, A POS (10/23 0805) Antibody: NEG (10/23 0805) Rubella: Immune (04/11 0000) RPR: Nonreactive (04/11 0000)  HBsAg: Negative (04/11 0000)  HIV: Non-reactive (04/11  0000)  GBS: Positive (09/19 0000)  Glucola normal Anatomy sono - normal, good interval growth, suspect 9 lbs  Assessment/Plan: 27 yo, G2P0 at 40.2 wks, left pneumonectomy with stable pulmonary function test and saw Pulmonologist 2 days back. GBS+. PCN per protocol.  Pitocin per protocol. FHT category I. Continue labor progress. Anticipate LGA, 9 lbs, reviewed shoulder dystocia risks.    Doralee Kocak R 02/11/2013, 2:20 PM

## 2013-02-11 NOTE — Anesthesia Procedure Notes (Signed)
Epidural Patient location during procedure: OB Start time: 02/11/2013 12:16 PM  Staffing Anesthesiologist: Angus Seller., Harrell Gave. Performed by: anesthesiologist   Preanesthetic Checklist Completed: patient identified, site marked, surgical consent, pre-op evaluation, timeout performed, IV checked, risks and benefits discussed and monitors and equipment checked  Epidural Patient position: sitting Prep: site prepped and draped and DuraPrep Patient monitoring: continuous pulse ox and blood pressure Approach: midline Injection technique: LOR air  Needle:  Needle type: Tuohy  Needle gauge: 17 G Needle length: 9 cm and 9 Needle insertion depth: 5 cm cm Catheter type: closed end flexible Catheter size: 19 Gauge Catheter at skin depth: 10 cm Test dose: negative  Assessment Events: blood not aspirated, injection not painful, no injection resistance, negative IV test and no paresthesia  Additional Notes Patient identified.  Risk benefits discussed including failed block, incomplete pain control, headache, nerve damage, paralysis, blood pressure changes, nausea, vomiting, reactions to medication both toxic or allergic, and postpartum back pain.  Also discussed specific risks associated with her one lung status including inability to give larger doses of medication and the need to monitor her respiratory function more closely.  Patient expressed understanding and wished to proceed.  All questions were answered.  Sterile technique used throughout procedure and epidural site dressed with sterile barrier dressing. No paresthesia or other complications noted.The patient did not experience any signs of intravascular injection such as tinnitus or metallic taste in mouth nor signs of intrathecal spread such as rapid motor block. Please see nursing notes for vital signs.

## 2013-02-11 NOTE — Progress Notes (Signed)
Danely Bayliss is a 27 y.o. G2P0010 at [redacted]w[redacted]d. IOL for maternal discomfort.  Subjective: Epidural working well.   Objective: BP 106/69  Pulse 95  Temp(Src) 97.7 F (36.5 C) (Oral)  Resp 20  Ht 5' 8.5" (1.74 m)  Wt 254 lb (115.214 kg)  BMI 38.05 kg/m2  SpO2 98%  LMP 05/05/2012   Total I/O In: -  Out: 650 [Urine:650]  FHT:  FHR: 135 bpm, variability: moderate,  accelerations:  Present,  decelerations:  Absent UC:   regular, every 3-4 minutes SVE:   Dilation: 5 Effacement (%): 100 Station: -2;-1 Exam by:: [employer]  Labs: Lab Results  Component Value Date   WBC 12.6* 02/11/2013   HGB 10.8* 02/11/2013   HCT 31.4* 02/11/2013   MCV 82.2 02/11/2013   PLT 196 02/11/2013    Assessment / Plan: Induction of labor due to [facility] medical conditions,  progressing well on pitocin  Labor: Progressing normally Fetal Wellbeing:  Category I Pain Control:  Epidural I/D:  PCN coverage for GBS ongoing Anticipated MOD:  NSVD, guarded, suspect LGA  Paislynn Hegstrom R 02/11/2013, 9:31 PM

## 2013-02-12 ENCOUNTER — Encounter (HOSPITAL_COMMUNITY): Payer: Self-pay

## 2013-02-12 ENCOUNTER — Encounter (HOSPITAL_COMMUNITY)
Admission: RE | Disposition: A | Discharge status: Home / Self Care | Payer: Self-pay | Source: Ambulatory Visit | Attending: Obstetrics & Gynecology

## 2013-02-12 DIAGNOSIS — Z98891 History of uterine scar from previous surgery: Secondary | ICD-10-CM

## 2013-02-12 HISTORY — DX: History of uterine scar from previous surgery: Z98.891

## 2013-02-12 LAB — CBC
HCT: 25.5 % — ABNORMAL LOW (ref 36.0–46.0)
Hemoglobin: 9 g/dL — ABNORMAL LOW (ref 12.0–15.0)
MCHC: 35.3 g/dL (ref 30.0–36.0)
MCV: 82.3 fL (ref 78.0–100.0)
Platelets: 165 10*3/uL (ref 150–400)
RBC: 3.1 MIL/uL — ABNORMAL LOW (ref 3.87–5.11)
WBC: 18.3 10*3/uL — ABNORMAL HIGH (ref 4.0–10.5)

## 2013-02-12 SURGERY — Surgical Case
Anesthesia: Epidural | Site: Abdomen | Wound class: Clean Contaminated

## 2013-02-12 MED ORDER — ONDANSETRON HCL 4 MG/2ML IJ SOLN
INTRAMUSCULAR | Status: DC | PRN
  Administered 2013-02-12: 4 mg via INTRAVENOUS

## 2013-02-12 MED ORDER — SODIUM CHLORIDE 0.9 % IJ SOLN: 3.0000 mL | INTRAMUSCULAR | Status: DC | PRN

## 2013-02-12 MED ORDER — MORPHINE SULFATE (PF) 0.5 MG/ML IJ SOLN
INTRAMUSCULAR | Status: DC | PRN
  Administered 2013-02-12: 3 mg via EPIDURAL
  Administered 2013-02-12: 2 mg via INTRAVENOUS

## 2013-02-12 MED ORDER — METOCLOPRAMIDE HCL 5 MG/ML IJ SOLN: 10.0000 mg | Freq: Three times a day (TID) | INTRAMUSCULAR | Status: DC | PRN

## 2013-02-12 MED ORDER — SIMETHICONE 80 MG PO CHEW: 80.0000 mg | CHEWABLE_TABLET | ORAL | Status: DC | PRN

## 2013-02-12 MED ORDER — FENTANYL CITRATE 0.05 MG/ML IJ SOLN
25.0000 ug | INTRAMUSCULAR | Status: DC | PRN
  Administered 2013-02-12: 50 ug via INTRAVENOUS

## 2013-02-12 MED ORDER — SENNOSIDES-DOCUSATE SODIUM 8.6-50 MG PO TABS
2.0000 | ORAL_TABLET | ORAL | Status: DC
  Administered 2013-02-12 – 2013-02-13 (×2): 2 via ORAL
  Filled 2013-02-12 (×2): qty 2

## 2013-02-12 MED ORDER — SODIUM BICARBONATE 8.4 % IV SOLN
INTRAVENOUS | Status: AC
  Filled 2013-02-12: qty 100

## 2013-02-12 MED ORDER — KETOROLAC TROMETHAMINE 30 MG/ML IJ SOLN: 30.0000 mg | Freq: Four times a day (QID) | INTRAMUSCULAR | Status: AC | PRN

## 2013-02-12 MED ORDER — LANOLIN HYDROUS EX OINT: 1.0000 "application " | TOPICAL_OINTMENT | CUTANEOUS | Status: DC | PRN

## 2013-02-12 MED ORDER — ALBUTEROL SULFATE HFA 108 (90 BASE) MCG/ACT IN AERS
2.0000 | INHALATION_SPRAY | Freq: Four times a day (QID) | RESPIRATORY_TRACT | Status: DC | PRN
  Filled 2013-02-12: qty 6.7

## 2013-02-12 MED ORDER — DOCUSATE SODIUM 100 MG PO CAPS
100.0000 mg | ORAL_CAPSULE | Freq: Two times a day (BID) | ORAL | Status: DC
  Administered 2013-02-12 – 2013-02-14 (×5): 100 mg via ORAL
  Filled 2013-02-12 (×5): qty 1

## 2013-02-12 MED ORDER — MEPERIDINE HCL 25 MG/ML IJ SOLN
INTRAMUSCULAR | Status: AC
  Filled 2013-02-12: qty 1

## 2013-02-12 MED ORDER — MEPERIDINE HCL 25 MG/ML IJ SOLN: 6.2500 mg | INTRAMUSCULAR | Status: DC | PRN

## 2013-02-12 MED ORDER — ONDANSETRON HCL 4 MG/2ML IJ SOLN
INTRAMUSCULAR | Status: AC
  Filled 2013-02-12: qty 2

## 2013-02-12 MED ORDER — NALOXONE HCL 1 MG/ML IJ SOLN
1.0000 ug/kg/h | INTRAVENOUS | Status: DC | PRN
  Filled 2013-02-12: qty 2

## 2013-02-12 MED ORDER — FENTANYL CITRATE 0.05 MG/ML IJ SOLN
INTRAMUSCULAR | Status: AC
  Filled 2013-02-12: qty 2

## 2013-02-12 MED ORDER — KETOROLAC TROMETHAMINE 30 MG/ML IJ SOLN
30.0000 mg | Freq: Four times a day (QID) | INTRAMUSCULAR | Status: AC | PRN
  Administered 2013-02-12: 30 mg via INTRAVENOUS

## 2013-02-12 MED ORDER — OXYTOCIN 10 UNIT/ML IJ SOLN
INTRAMUSCULAR | Status: AC
  Filled 2013-02-12: qty 4

## 2013-02-12 MED ORDER — MEPERIDINE HCL 25 MG/ML IJ SOLN
INTRAMUSCULAR | Status: DC | PRN
  Administered 2013-02-12 (×2): 12.5 mg via INTRAVENOUS

## 2013-02-12 MED ORDER — DIPHENHYDRAMINE HCL 50 MG/ML IJ SOLN: 25.0000 mg | INTRAMUSCULAR | Status: DC | PRN

## 2013-02-12 MED ORDER — NALOXONE HCL 0.4 MG/ML IJ SOLN: 0.4000 mg | INTRAMUSCULAR | Status: DC | PRN

## 2013-02-12 MED ORDER — DIPHENHYDRAMINE HCL 25 MG PO CAPS: 25.0000 mg | ORAL_CAPSULE | ORAL | Status: DC | PRN

## 2013-02-12 MED ORDER — METOCLOPRAMIDE HCL 5 MG/ML IJ SOLN: 10.0000 mg | Freq: Once | INTRAMUSCULAR | Status: DC | PRN

## 2013-02-12 MED ORDER — SCOPOLAMINE 1 MG/3DAYS TD PT72
MEDICATED_PATCH | TRANSDERMAL | Status: AC
  Filled 2013-02-12: qty 1

## 2013-02-12 MED ORDER — ONDANSETRON HCL 4 MG PO TABS: 4.0000 mg | ORAL_TABLET | ORAL | Status: DC | PRN

## 2013-02-12 MED ORDER — NALBUPHINE HCL 10 MG/ML IJ SOLN
5.0000 mg | INTRAMUSCULAR | Status: DC | PRN
  Filled 2013-02-12: qty 1

## 2013-02-12 MED ORDER — OXYCODONE-ACETAMINOPHEN 5-325 MG PO TABS
1.0000 | ORAL_TABLET | ORAL | Status: DC | PRN
  Administered 2013-02-13 – 2013-02-14 (×2): 1 via ORAL
  Filled 2013-02-12 (×2): qty 1

## 2013-02-12 MED ORDER — LIDOCAINE-EPINEPHRINE (PF) 2 %-1:200000 IJ SOLN
INTRAMUSCULAR | Status: AC
  Filled 2013-02-12: qty 40

## 2013-02-12 MED ORDER — SCOPOLAMINE 1 MG/3DAYS TD PT72
1.0000 | MEDICATED_PATCH | Freq: Once | TRANSDERMAL | Status: DC
  Administered 2013-02-12: 1.5 mg via TRANSDERMAL

## 2013-02-12 MED ORDER — OXYTOCIN 40 UNITS IN LACTATED RINGERS INFUSION - SIMPLE MED: 62.5000 mL/h | INTRAVENOUS | Status: AC

## 2013-02-12 MED ORDER — CEFAZOLIN SODIUM-DEXTROSE 2-3 GM-% IV SOLR
2.0000 g | INTRAVENOUS | Status: AC
  Administered 2013-02-12: 2 g via INTRAVENOUS
  Filled 2013-02-12: qty 50

## 2013-02-12 MED ORDER — KETOROLAC TROMETHAMINE 30 MG/ML IJ SOLN
INTRAMUSCULAR | Status: AC
  Filled 2013-02-12: qty 1

## 2013-02-12 MED ORDER — IBUPROFEN 600 MG PO TABS
600.0000 mg | ORAL_TABLET | Freq: Four times a day (QID) | ORAL | Status: DC
  Administered 2013-02-12 – 2013-02-14 (×10): 600 mg via ORAL
  Filled 2013-02-12 (×10): qty 1

## 2013-02-12 MED ORDER — DIPHENHYDRAMINE HCL 50 MG/ML IJ SOLN: 12.5000 mg | INTRAMUSCULAR | Status: DC | PRN

## 2013-02-12 MED ORDER — DIBUCAINE 1 % RE OINT
1.0000 "application " | TOPICAL_OINTMENT | RECTAL | Status: DC | PRN
  Administered 2013-02-12: 1 via RECTAL
  Filled 2013-02-12: qty 28

## 2013-02-12 MED ORDER — MENTHOL 3 MG MT LOZG: 1.0000 | LOZENGE | OROMUCOSAL | Status: DC | PRN

## 2013-02-12 MED ORDER — DIPHENHYDRAMINE HCL 25 MG PO CAPS: 25.0000 mg | ORAL_CAPSULE | Freq: Four times a day (QID) | ORAL | Status: DC | PRN

## 2013-02-12 MED ORDER — LACTATED RINGERS IV SOLN
INTRAVENOUS | Status: DC | PRN
  Administered 2013-02-12: 01:00:00 via INTRAVENOUS

## 2013-02-12 MED ORDER — SIMETHICONE 80 MG PO CHEW
80.0000 mg | CHEWABLE_TABLET | Freq: Three times a day (TID) | ORAL | Status: DC
  Administered 2013-02-12 – 2013-02-14 (×8): 80 mg via ORAL
  Filled 2013-02-12 (×7): qty 1

## 2013-02-12 MED ORDER — LACTATED RINGERS IV SOLN: INTRAVENOUS | Status: DC

## 2013-02-12 MED ORDER — MORPHINE SULFATE 0.5 MG/ML IJ SOLN
INTRAMUSCULAR | Status: AC
  Filled 2013-02-12: qty 10

## 2013-02-12 MED ORDER — CEFAZOLIN SODIUM-DEXTROSE 2-3 GM-% IV SOLR
INTRAVENOUS | Status: AC
  Filled 2013-02-12: qty 50

## 2013-02-12 MED ORDER — ONDANSETRON HCL 4 MG/2ML IJ SOLN: 4.0000 mg | INTRAMUSCULAR | Status: DC | PRN

## 2013-02-12 MED ORDER — OXYTOCIN 10 UNIT/ML IJ SOLN
40.0000 [IU] | INTRAVENOUS | Status: DC | PRN
  Administered 2013-02-12: 40 [IU] via INTRAVENOUS

## 2013-02-12 MED ORDER — SODIUM BICARBONATE 8.4 % IV SOLN
INTRAVENOUS | Status: DC | PRN
  Administered 2013-02-12: 3 mL via EPIDURAL
  Administered 2013-02-12: 10 mL via EPIDURAL
  Administered 2013-02-12 (×2): 5 mL via EPIDURAL

## 2013-02-12 MED ORDER — SIMETHICONE 80 MG PO CHEW
80.0000 mg | CHEWABLE_TABLET | ORAL | Status: DC
  Filled 2013-02-12 (×2): qty 1

## 2013-02-12 MED ORDER — ZOLPIDEM TARTRATE 5 MG PO TABS: 5.0000 mg | ORAL_TABLET | Freq: Every evening | ORAL | Status: DC | PRN

## 2013-02-12 MED ORDER — LACTATED RINGERS IV SOLN
INTRAVENOUS | Status: DC | PRN
  Administered 2013-02-12 (×2): via INTRAVENOUS

## 2013-02-12 MED ORDER — ONDANSETRON HCL 4 MG/2ML IJ SOLN: 4.0000 mg | Freq: Three times a day (TID) | INTRAMUSCULAR | Status: DC | PRN

## 2013-02-12 MED ORDER — WITCH HAZEL-GLYCERIN EX PADS
1.0000 "application " | MEDICATED_PAD | CUTANEOUS | Status: DC | PRN
  Administered 2013-02-12: 1 via TOPICAL

## 2013-02-12 MED ORDER — TETANUS-DIPHTH-ACELL PERTUSSIS 5-2.5-18.5 LF-MCG/0.5 IM SUSP: 0.5000 mL | Freq: Once | INTRAMUSCULAR | Status: DC

## 2013-02-12 MED ORDER — PRENATAL MULTIVITAMIN CH
1.0000 | ORAL_TABLET | Freq: Every day | ORAL | Status: DC
  Administered 2013-02-12 – 2013-02-14 (×3): 1 via ORAL
  Filled 2013-02-12 (×3): qty 1

## 2013-02-12 MED ORDER — LORATADINE 10 MG PO TABS
10.0000 mg | ORAL_TABLET | Freq: Every day | ORAL | Status: DC
  Administered 2013-02-12 – 2013-02-14 (×3): 10 mg via ORAL
  Filled 2013-02-12 (×4): qty 1

## 2013-02-12 SURGICAL SUPPLY — 39 items
APL SKNCLS STERI-STRIP NONHPOA (GAUZE/BANDAGES/DRESSINGS)
BENZOIN TINCTURE PRP APPL 2/3 (GAUZE/BANDAGES/DRESSINGS) IMPLANT
CLAMP CORD UMBIL (MISCELLANEOUS) IMPLANT
CLOTH BEACON ORANGE TIMEOUT ST (SAFETY) ×2 IMPLANT
CONTAINER PREFILL 10% NBF 15ML (MISCELLANEOUS) IMPLANT
DRAPE LG THREE QUARTER DISP (DRAPES) ×4 IMPLANT
DRSG OPSITE POSTOP 4X10 (GAUZE/BANDAGES/DRESSINGS) ×2 IMPLANT
DURAPREP 26ML APPLICATOR (WOUND CARE) ×2 IMPLANT
ELECT REM PT RETURN 9FT ADLT (ELECTROSURGICAL) ×2
ELECTRODE REM PT RTRN 9FT ADLT (ELECTROSURGICAL) ×1 IMPLANT
EXTRACTOR VACUUM KIWI (MISCELLANEOUS) IMPLANT
EXTRACTOR VACUUM M CUP 4 TUBE (SUCTIONS) IMPLANT
GLOVE BIO SURGEON STRL SZ7 (GLOVE) ×2 IMPLANT
GLOVE BIOGEL PI IND STRL 7.0 (GLOVE) ×1 IMPLANT
GLOVE BIOGEL PI INDICATOR 7.0 (GLOVE) ×1
GOWN PREVENTION PLUS XLARGE (GOWN DISPOSABLE) ×4 IMPLANT
GOWN STRL REIN XL XLG (GOWN DISPOSABLE) ×4 IMPLANT
KIT ABG SYR 3ML LUER SLIP (SYRINGE) IMPLANT
NDL HYPO 25X5/8 SAFETYGLIDE (NEEDLE) IMPLANT
NEEDLE HYPO 25X5/8 SAFETYGLIDE (NEEDLE) IMPLANT
NS IRRIG 1000ML POUR BTL (IV SOLUTION) ×2 IMPLANT
PACK C SECTION WH (CUSTOM PROCEDURE TRAY) ×2 IMPLANT
PAD OB MATERNITY 4.3X12.25 (PERSONAL CARE ITEMS) ×2 IMPLANT
RTRCTR C-SECT PINK 25CM LRG (MISCELLANEOUS) IMPLANT
STAPLER VISISTAT 35W (STAPLE) IMPLANT
STRIP CLOSURE SKIN 1/4X4 (GAUZE/BANDAGES/DRESSINGS) IMPLANT
SUT MON AB-0 CT1 36 (SUTURE) ×6 IMPLANT
SUT PLAIN 0 NONE (SUTURE) IMPLANT
SUT PLAIN 2 0 (SUTURE)
SUT PLAIN ABS 2-0 CT1 27XMFL (SUTURE) IMPLANT
SUT VIC AB 0 CT1 27 (SUTURE) ×4
SUT VIC AB 0 CT1 27XBRD ANBCTR (SUTURE) ×2 IMPLANT
SUT VIC AB 2-0 CT1 27 (SUTURE) ×4
SUT VIC AB 2-0 CT1 TAPERPNT 27 (SUTURE) ×2 IMPLANT
SUT VIC AB 4-0 KS 27 (SUTURE) IMPLANT
SUT VICRYL 0 TIES 12 18 (SUTURE) IMPLANT
TOWEL OR 17X24 6PK STRL BLUE (TOWEL DISPOSABLE) ×2 IMPLANT
TRAY FOLEY CATH 14FR (SET/KITS/TRAYS/PACK) IMPLANT
WATER STERILE IRR 1000ML POUR (IV SOLUTION) ×2 IMPLANT

## 2013-02-12 NOTE — Anesthesia Postprocedure Evaluation (Signed)
Anesthesia Post Note  Patient: Tracy Gutierrez  Procedure(s) Performed: Procedure(s): Primary CESAREAN SECTION (N/A)  Anesthesia type: Epidural  Patient location: Mother/Baby  Post pain: Pain level controlled  Post assessment: Post-op Vital signs reviewed  Last Vitals: BP 95/56  Pulse 81  Temp(Src) 37.4 C (Oral)  Resp 20  Ht 5' 8.5" (1.74 m)  Wt 244 lb 11.2 oz (110.995 kg)  BMI 36.66 kg/m2  SpO2 98%  LMP 05/05/2012  Post vital signs: Reviewed  Level of consciousness: awake  Complications: No apparent anesthesia complications

## 2013-02-12 NOTE — Progress Notes (Signed)
Tracy Gutierrez is a 27 y.o. G2P0010 at [redacted]w[redacted]d, IOL for maternal relief who is s/p one pneumonectomy at 14 wks..   Subjective: Pain not well controlled. Pitocin off 1 hr, she is in spontaneous pattern without any relief of pain and epidural boluses are not helping, she is max'ed out on her basal concentration per on call anesthesiologist.   Objective: BP 148/83  Pulse 106  Temp(Src) 98.3 F (36.8 C) (Oral)  Resp 18  Ht 5' 8.5" (1.74 m)  Wt 254 lb (115.214 kg)  BMI 38.05 kg/m2  SpO2 98%  LMP 05/05/2012   Total I/O In: -  Out: 950 [Urine:950]  FHT:  130s/ + accels/ no decels/ mod variab-category I UC:   regular, every 2 minutes SVE:   6/90%/ anterior lip swollen, caput noted, station -2  5 cm at 8 pm now at 6 cm since 2 hrs, essentially arrest of dilatation  Labs: Lab Results  Component Value Date   WBC 12.6* 02/11/2013   HGB 10.8* 02/11/2013   HCT 31.4* 02/11/2013   MCV 82.2 02/11/2013   PLT 196 02/11/2013    Assessment / Plan: Arrest in active phase of labor, pt does not want to rest and restart pitocin since even her current contractions are too painful for her and she has not made progress as expected.   Labor: maternal labor intolerance and arrest of dilatation, proceed with primary c-section.  Fetal Wellbeing:  Category I  Risks/complications of surgery reviewed incl infection, bleeding, damage to internal organs including bladder, bowels, ureters, blood vessels, other risks from anesthesia, VTE and delayed complications of any surgery, complications in future surgery reviewed. Also discussed neonatal complications incl difficult delivery, laceration, vacuum assistance, TTN etc. Pt understands and agrees, all concerns addressed.    Cal Gindlesperger R 02/12/2013, 12:24 AM

## 2013-02-12 NOTE — Progress Notes (Signed)
Subjective: Patient reports feeling well, has tolerated clears and is hungry, ok to eat. Denies SOB/CP/ dizziness. Slight incisional pain but ready to breast feed and baby just arrived back from Nursery (was brought to nursery for low temp, has improved, FOB can do skin-skin if mom not able to).      Objective: I have reviewed patient's vital signs, intake and output, medications and labs. BP 125/72  Pulse 102  Temp(Src) 100 F (37.8 C) (Oral)  Resp 20  Ht 5' 8.5" (1.74 m)  Wt 244 lb 11.2 oz (110.995 kg)  BMI 36.66 kg/m2  SpO2 98%  LMP 05/05/2012  General: alert and cooperative, NAD Lungs CTA bilateral CV RRR Abdo soft, NT, active BS Extr SCDs   Assessment/Plan: POD# 0, stable, routine post-op care. Left Pneumonectomy status, stable cardio-pulmonary evaluation, Possible transfer out of AICU at 12-18 hrs PP is stable.    Tracy Gutierrez R 02/12/2013, 10:55 AM

## 2013-02-12 NOTE — Transfer of Care (Signed)
Immediate Anesthesia Transfer of Care Note  Patient: Tracy Gutierrez  Procedure(s) Performed: Procedure(s): Primary CESAREAN SECTION (N/A)  Patient Location: PACU  Anesthesia Type:Epidural  Level of Consciousness: awake, alert , oriented and patient cooperative  Airway & Oxygen Therapy: Patient Spontanous Breathing  Post-op Assessment: Report given to PACU RN, Post -op Vital signs reviewed and stable and Patient moving all extremities  Post vital signs: Reviewed and stable  Complications: No apparent anesthesia complications

## 2013-02-12 NOTE — Progress Notes (Signed)
This was a follow-up visit with pt and her family.  Tracy Gutierrez was very tired and in need of some rest.  She did not report any particular needs at this time, but is aware of on-going availability of chaplain support.  She has good family support, though she may need some assistance in speaking with her family about the importance of her own rest.  [name] Pager, [phone] 12:41 PM   02/12/13 1200  Clinical Encounter Type  Visited With Patient and family together  Visit Type Spiritual support;Follow-up

## 2013-02-12 NOTE — Op Note (Signed)
Tracy Gutierrez 02/12/2013  Procedure:  Primary Low Transverse Cesarean Section   Indications: Dystocia, arrest of dilatation at 6 cm  Pre-operative Diagnosis: 40.3 wks, labor induction for maternal fatigue.                                                Failure to Progress at 6 cm.                                                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 She has protracted active phase and was at 6 cm for over 2 hrs with no descent. The risks, benefits, complications, treatment options, and expected outcomes were discussed with the patient. The patient concurred with the proposed plan, giving informed consent. identified as Tracy Gutierrez and the procedure verified as C-Section Delivery. A Time Out was held and the above information confirmed.  2 gm Ancef given. After induction of anesthesia, the patient was draped and prepped in the usual sterile manner. A Pfannenstiel Incision was made and carried down through the subcutaneous tissue to the fascia. Fascial incision was made and extended transversely. The fascia was separated from the underlying rectus tissue superiorly and inferiorly. The peritoneum was identified and entered. Peritoneal incision was extended longitudinally. Alexis-O retractor was placed carefully. The utero-vesical peritoneal reflection was incised transversely and the bladder flap was bluntly freed from the lower uterine segment. A low transverse uterine incision was made. Delivered from Cephalic OT position at 1.25 am on 02/12/13 was a vigorous healthy FEMALE infant with Apgar scores of 9 at one minute and 9 at five minutes. Cord clamped and cut, baby handed to NICU team in attendance. Cord ph was not sent,  the umbilical cord blood was obtained for evaluation. The  placenta was removed Intact and appeared normal. The uterine outline, tubes and ovaries appeared normal. The uterine incision was closed with running locked sutures of in 2 layers. Hemostasis was observed. Peritoneum was closed with 2-0 Vicryl. The fascia was then reapproximated with running sutures of 0Vicryl. The subcuticular closure was performed using 2-0plain gut. The skin was closed with 4-0Vicryl. Steristrips, surgical dressings placed.   Instrument, sponge, and needle counts were correct prior the abdominal closure and were correct at the conclusion of the case.    Findings: Female infant delivered Cephalic via Kerr (Low Transverse) Hysterotomy at 1.25 am on 02/12/13 with Apgars 9 and 9. Weight pending. Normal placenta, uterus, tubes and ovaries. Clear amniotic fluid.    Estimated Blood Loss: 1000 cc  Total IV Fluids: 2500 ml   Urine Output: 100CC OF clear urine but concentrated  Specimens: Cord blood  Complications: no complications  Disposition: PACU - hemodynamically stable. and will then go to AICU for observation due to Pneumonectomy history  Maternal Condition: stable   Baby condition / location:  nursery-stable  Attending Attestation: I performed the procedure.   Signed: Surgeon(s): Robley Fries, MD

## 2013-02-12 NOTE — Anesthesia Postprocedure Evaluation (Signed)
Anesthesia Post Note  Patient: Tracy Gutierrez  Procedure(s) Performed: Procedure(s) (LRB): Primary CESAREAN SECTION (N/A)  Anesthesia type: Epidural  Patient location: Mother/Baby  Post pain: Pain level controlled  Post assessment: Post-op Vital signs reviewed  Last Vitals:  Filed Vitals:   02/12/13 0600  BP:   Pulse: 80  Temp:   Resp:     Post vital signs: Reviewed  Level of consciousness:alert  Complications: No apparent anesthesia complications

## 2013-02-12 NOTE — Anesthesia Postprocedure Evaluation (Signed)
  Anesthesia Post-op Note  Patient: Elpidio Anis  Procedure(s) Performed: Procedure(s): Primary CESAREAN SECTION (N/A)  Patient Location: PACU  Anesthesia Type:Epidural  Level of Consciousness: awake, alert  and oriented  Airway and Oxygen Therapy: Patient Spontanous Breathing  Post-op Pain: none  Post-op Assessment: Post-op Vital signs reviewed, Patient's Cardiovascular Status Stable, Respiratory Function Stable, Patent Airway, No signs of Nausea or vomiting, Pain level controlled, No headache, No backache and No residual numbness  Post-op Vital Signs: Reviewed and stable  Complications: No apparent anesthesia complications

## 2013-02-12 NOTE — Lactation Note (Signed)
This note was copied from the chart of Tracy Gutierrez. Lactation Consultation Note Mom remains concerned that baby is not latching. Mom needed pump replacement pieces to connect to DEBP that were lost in AICU.  Pieces given and encouraged to pump every 3 hours for stimulation.  Baby is fussy at the breast and acting gassy.  Removed from breast and burped a few times.  Baby stretches and screams at the breast.  Removed again to assess mouth.  Baby has a recessed chin with prominent lower gumline.  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  Baby used gums to latch to gloved finger and has a high palate with disorganized sucking more biting and holding on finger.  Baby does not cup tongue onto finger.  Attempted #20 nipple shield with good fit.  Instructed pt. On use.  Multiple attempts to latch on left breast with NS in football.  Repositioned into cross cradle.  Baby a little more relaxed and sucked a few time, but held in mouth for a few minutes.  Baby very fussy with adjustments in position.  Encouraged skin to skin feeding on demand with cues, hand expression and spoon feeding is available to mom and she has been shown previously.  Encouraged to pump every 3 hours.  MBU RN aware of visit.    Patient Name: Tracy Gutierrez [id number] Date: 02/12/2013     Maternal Data    Feeding Feeding Type: Breast Fed Length of feed:  (few sucks)  LATCH Score/Interventions Latch: Repeated attempts needed to sustain latch, nipple held in mouth throughout feeding, stimulation needed to elicit sucking reflex. Intervention(s): Skin to skin;Teach feeding cues;Waking techniques Intervention(s): Adjust position;Assist with latch;Breast massage  Audible Swallowing: None Intervention(s): Skin to skin;Hand expression  Type of Nipple: Flat  Comfort (Breast/Nipple): Soft / non-tender     Hold (Positioning): Assistance needed to correctly  position infant at breast and maintain latch. Intervention(s): Breastfeeding basics reviewed;Support Pillows;Position options;Skin to skin  LATCH Score: 5  Lactation Tools Discussed/Used Tools: Shells;Pump;Nipple Shields Nipple shield size: 20 Breast pump type: Double-Electric Breast Pump   Consult Status Consult Status: Follow-up Date: 02/13/13 Follow-up type: In-patient    Beverely Risen Arvella Merles 02/12/2013, 10:33 PM

## 2013-02-13 LAB — CBC
HCT: 23.5 % — ABNORMAL LOW (ref 36.0–46.0)
Hemoglobin: 8 g/dL — ABNORMAL LOW (ref 12.0–15.0)
MCH: 28.4 pg (ref 26.0–34.0)
MCHC: 34 g/dL (ref 30.0–36.0)
MCV: 83.3 fL (ref 78.0–100.0)
Platelets: 159 10*3/uL (ref 150–400)

## 2013-02-13 MED ORDER — POLYSACCHARIDE IRON COMPLEX 150 MG PO CAPS
150.0000 mg | ORAL_CAPSULE | Freq: Every day | ORAL | Status: DC
  Administered 2013-02-13 – 2013-02-14 (×2): 150 mg via ORAL
  Filled 2013-02-13 (×2): qty 1

## 2013-02-13 NOTE — Progress Notes (Signed)
Patient ID: Tracy Gutierrez, female   DOB: [birth year], 27 y.o.   MRN: [id number] Patient seen, labs reviewed, VS stable, Anemia, agree with CNM note, start Iron bid when tolerated. Ambulating well, pain well controlled. Anticipate D/c tomorrow if stable but office f/up in 2 wks due to maternal medical condition.  V.Leaner Morici, MD

## 2013-02-13 NOTE — Progress Notes (Signed)
Clinical Social Work Department PSYCHOSOCIAL ASSESSMENT - MATERNAL/CHILD 02/13/2013  Patient:  Tracy Gutierrez,Tracy Gutierrez  Account Number:  [id number]  Admit Date:  02/11/2013  Childs Name:   Tracy Gutierrez    Clinical Social Worker:  Emilyn Ruble, LCSW   Date/Time:  02/13/2013 10:00 AM  Date Referred:  02/12/2013   Referral source  Central Nursery     Referred reason  Depression/Anxiety   Other referral source:    I:  FAMILY / HOME ENVIRONMENT Child's legal guardian:  PARENT  Guardian - Name Guardian - Age Guardian - Address  Gutierrez,Tracy 27 [address]  [place], [place] [address]  Gutierrez, Tracy  same as above   Other household support members/support persons Other support:    II  PSYCHOSOCIAL DATA Information Source:    Financial and Community Resources Employment:   Father is employed   Financial resources:  Private Insurance If Medicaid - County:   Other  Food Stamps  WIC   School / Grade:   Maternity Care Coordinator / Child Services Coordination / Early Interventions:  Cultural issues impacting care:    III  STRENGTHS Strengths  Supportive family/friends Have all baby care items/supplies   Strength comment:      V  SOCIAL WORK ASSESSMENT Met with mother who was pleasant and receptive to social work intervention.  She and newborn's father cohabitate.  She have no other dependents.  Father is employed.   Mother states that earlier this year she had major surgery to remove her left lung.  She denies hx of psychiatric hospitalization.    She denies any current depressive symptoms or anxiety.  She also denies any hx of substance abuse.  Discussed signs/symptoms of PP depression with mother and provided her with literature and treatment resources if needed.   Mother reports having family support.   No acute social concerns noted or reported at this time.  Informed her of social work availability.   VI SOCIAL WORK PLAN Social Work Plan  No Further Intervention  Required / No Barriers to Discharge   Caylei Sperry J, LCSW  

## 2013-02-13 NOTE — Progress Notes (Signed)
POSTOPERATIVE DAY # 1 S/P Primary LTCS   S:         Reports feeling good, but sore.              Tolerating po intake / no nausea / no vomiting / passing  flatus / no BM             Bleeding is light             Pain controlled withMotrin              Up ad lib / ambulatory/ voiding QS  Newborn bottle feeding  / Circumcision needs to be scheduled   O:  VS: BP 93/63  Pulse 76  Temp(Src) 98.3 F (36.8 C) (Oral)  Resp 16  Ht 5' 8.5" (1.74 m)  Wt 110.995 kg (244 lb 11.2 oz)  BMI 36.66 kg/m2  SpO2 99%  LMP 05/05/2012   LABS:               Recent Labs  02/12/13 0645 02/13/13 0617  WBC 18.3* 15.1*  HGB 9.0* 8.0*  PLT 165 159               Bloodtype: --/--/A POS, A POS (10/23 0805)  Rubella: Immune (04/11 0000)                                             I&O: 10/24 2750 mL In/1400 mLOut    Net +350                 Physical Exam:             Alert and Oriented X3  Lungs: Clear and unlabored  Heart: regular rate and rhythm / no mumurs  Abdomen: soft, non-tender, non-distended, +BS             Fundus: firm, non-tender, U/U             Dressing: Honeycomb and tegaderm dry and intact.  Scant 2 cm amt dried blood on right side.             Incision:  approximated with suture / no erythema / no ecchymosis / no drainage  Perineum: Intact, not swollen  Lochia: Light rubra, no clots  Extremities: Scant edema, no calf pain or tenderness, neg Homans, no s/sx DVT  A/P:       POD # 1 S/P Primary LTCS             Routine postoperative care     Hx of left pneumonectomy--monitor for fluid shift  Restart strict I&O  IDA--Hgb down from 10.8 to 8.0  Start iron supplementation with Niferex 150 mg qday  Recheck CBC in 6 weeks                   Cyndee Brightly SNM 02/13/2013, 11:56 AM

## 2013-02-14 MED ORDER — POLYSACCHARIDE IRON COMPLEX 150 MG PO CAPS: 150.0000 mg | ORAL_CAPSULE | Freq: Every day | ORAL | Status: AC

## 2013-02-14 MED ORDER — OXYCODONE-ACETAMINOPHEN 5-325 MG PO TABS: 1.0000 | ORAL_TABLET | ORAL | Status: AC | PRN

## 2013-02-14 MED ORDER — IBUPROFEN 600 MG PO TABS: 600.0000 mg | ORAL_TABLET | Freq: Four times a day (QID) | ORAL | Status: AC

## 2013-02-14 MED ORDER — MAGNESIUM OXIDE 250 MG PO TABS: 1.0000 | ORAL_TABLET | Freq: Every day | ORAL | Status: AC

## 2013-02-14 NOTE — Progress Notes (Signed)
Seen and agree - likely early DC tomorrow - Marlinda Mike CNM MSN [facility]

## 2013-02-14 NOTE — Progress Notes (Signed)
POSTOPERATIVE DAY # 2 S/P CS   S:         Reports feeling well             Tolerating po intake / no nausea / no vomiting / + flatus / no BM             Bleeding is light             Pain controlled with motrin and percocet             Up ad lib / ambulatory/ voiding QS  Newborn bottle feeding  / Circumcision done   O:  VS: BP 124/81  Pulse 80  Temp(Src) 98 F (36.7 C) (Oral)  Resp 18  Ht 5' 8.5" (1.74 m)  Wt 110.995 kg (244 lb 11.2 oz)  BMI 36.66 kg/m2  SpO2 99%  LMP 05/05/2012   LABS:               Recent Labs  02/12/13 0645 02/13/13 0617  WBC 18.3* 15.1*  HGB 9.0* 8.0*  PLT 165 159               Bloodtype: --/--/A POS, A POS (10/23 0805)  Rubella: Immune (04/11 0000)                                             I&O: Intake/Output     10/25 0701 - 10/26 0700 10/26 0701 - 10/27 0700   P.O. 660    Total Intake(mL/kg) 660 (5.9)    Urine (mL/kg/hr) 425 (0.2)    Total Output 425     Net +235                       Physical Exam:             Alert and Oriented X3  Lungs: Clear and unlabored  Heart: regular rate and rhythm / no mumurs  Abdomen: soft, non-tender, non-distended active BS             Fundus: firm, non-tender, Ueven             Dressing intact honeycomb              Incision:  approximated with subcuticular / no erythema / no ecchymosis / no drainage  Perineum: no edema  Lochia: light  Extremities: trace edema, no calf pain or tenderness, negative Homans  A:        POD # 2 S/P CS            HX left pneumonectomy            Anemia - hemodynamically stable  P:        Routine postoperative care              DC home - OV this week follow-up with dependent edema    Marlinda Mike CNM, MSN, [facility] 02/14/2013, 11:20 AM

## 2013-02-14 NOTE — Discharge Summary (Signed)
MD note Pt was seen on postpartum floor after transferring out of AICU observation and was stable, agree with above note and plan.

## 2013-02-14 NOTE — Discharge Summary (Signed)
POSTOPERATIVE DISCHARGE SUMMARY:  Patient ID: Tracy Gutierrez MRN: [id number] DOB/AGE: [birth year] 27 y.o.  Admit date: 02/11/2013 Admission Diagnoses:40 weeks / maternal fatigue / LGA / hx left pneumonectomy r/t CA (14wks)/ IDA  Discharge date:  02/14/2013 Discharge Diagnoses: POD 2 s/p cesarean section for arrest of active labor / IDA compounded ABL  Prenatal history: G2P1011   EDC : 02/09/2013, by Last Menstrual Period  P[facility] care at [facility]  Primary provider : Mody Prenatal course complicated by hx lung mass dx 10 week - left pneumonectomy r/t CA / PCOS / Obesity / IDA / positive GBS  Prenatal Labs: ABO, Rh: --/--/A POS, A POS (10/23 0805) Antibody: NEG (10/23 0805) Rubella: Immune (04/11 0000)   RPR: NON REACTIVE (10/23 0805)  HBsAg: Negative (04/11 0000)  HIV: Non-reactive (04/11 0000)  GTT : positive GBS: Positive (09/19 0000)   Medical / Surgical History :  Past medical history:  Past Medical History  Diagnosis Date  . Allergy   . Anxiety   . Asthma   . PCOS (polycystic ovarian syndrome)   . 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 Obesity, unspecified   . Abnormal Pap smear   . 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 Postpartum care following cesarean delivery (02/12/13) 02/12/2013  . S/P primary low transverse C-section 02/12/2013    Past surgical history:  Past Surgical History  Procedure Laterality Date  . Dilation and curettage of uterus  2011  . Lung surgery  07/2012    left  . Anal sphincter tear  09/2012    Family History:  Family History  Problem Relation Age of Onset  . Diabetes Mother   . Heart disease Mother   . Hypertension Mother   . Pancreatitis Mother   . Thyroid disease Mother   . Hypertension Father   . Diabetes Maternal Grandmother   . Hyperlipidemia Maternal Grandmother   . Hypertension Maternal Grandmother   . Breast cancer Maternal Grandmother   .  Hyperlipidemia Paternal Grandmother   . Hypertension Paternal Grandmother   . Diabetes Paternal Grandfather   . Heart disease Paternal Grandfather     Social History:  reports that she has never smoked. She has never used smokeless tobacco. She reports that she does not drink alcohol or use illicit drugs.  Allergies: Review of patient's allergies indicates no known allergies.   Current Medications at time of admission:  Prenatal  Zyrtec  Albuteral inhaler Colace  Intrapartum Course:  Admit for induction of labor with labor progression to 6cm dilation with protracted labor curve Pain management: epidural Complicated by: arrest of active labor Interventions required: cesarean delivery  Procedures: Cesarean section delivery on 02/12/2013 with delivery of Female newborn by Dr Juliene Pina  See operative report for further details APGAR (1 MIN): 9   APGAR (5 MINS): 9    Postoperative / postpartum course:  Uncomplicated with discharge on POD 2  Physical Exam:   VSS: Temp:  [98 F (36.7 C)-98.3 F (36.8 C)] 98 F (36.7 C) (10/26 0540) Pulse Rate:  [80-94] 80 (10/26 0540) Resp:  [18-20] 18 (10/26 0540) BP: (112-124)/(77-81) 124/81 mmHg (10/26 0540)  LABS:  Recent Labs  02/12/13 0645 02/13/13 0617  WBC 18.3* 15.1*  HGB 9.0* 8.0*  PLT 165 159    General: pleasant / ambulatory Heart: RRR Lungs: clear  Abdomen: soft and non-tender / non-distended / active BS  Extremities: trace edema / negative Homans  Dressing: intact honeycomb dressing Incision:  approximated with subcuticular / no erythema / no ecchymosis / no drainage  Discharge Instructions:  Discharged Condition: stable  Activity: pelvic rest and postoperative restrictions x 2   Diet: routine  Medications:    Medication List         albuterol 108 (90 BASE) MCG/ACT inhaler  Commonly known as:  PROVENTIL HFA;VENTOLIN HFA  Inhale 2 puffs into the lungs every 6 (six) hours as needed.     cetirizine 10 MG  tablet  Commonly known as:  ZYRTEC  Take 10 mg by mouth daily.     COLACE PO  Take 1 tablet by mouth daily as needed.     ibuprofen 600 MG tablet  Commonly known as:  ADVIL,MOTRIN  Take 1 tablet (600 mg total) by mouth every 6 (six) hours.     iron polysaccharides 150 MG capsule  Commonly known as:  NIFEREX  Take 1 capsule (150 mg total) by mouth daily.     Magnesium Oxide 250 MG Tabs  Take 1-2 tablets (250-500 mg total) by mouth daily.     multivitamin-prenatal 27-0.8 MG Tabs tablet  Take 1 tablet by mouth daily at 12 noon.     oxyCODONE-acetaminophen 5-325 MG per tablet  Commonly known as:  PERCOCET/ROXICET  Take 1-2 tablets by mouth every 4 (four) hours as needed.        Wound Care: keep clean and dry / office visit this week for incision check and dressing removal Postpartum Instructions: [facility] discharge booklet - instructions reviewed  Discharge to: Home  Follow up :  [facility] in 2-4 days for interval visit with midwife for incision exam / evaluation of edema [facility] in 6 weeks for routine postpartum visit with Dr Juliene Pina PCP - 2 weeks for medical follow-up as scheduled                Signed: Marlinda Mike CNM, MSN, [facility] 02/14/2013, 11:28 AM

## 2013-02-15 ENCOUNTER — Encounter (HOSPITAL_COMMUNITY): Payer: Self-pay | Admitting: Obstetrics & Gynecology

## 2013-04-08 ENCOUNTER — Ambulatory Visit: Payer: BC Managed Care – PPO | Admitting: Pulmonary Disease

## 2014-02-21 ENCOUNTER — Encounter (HOSPITAL_COMMUNITY): Payer: Self-pay | Admitting: Obstetrics & Gynecology

## 2014-12-30 DIAGNOSIS — S46911A Strain of unspecified muscle, fascia and tendon at shoulder and upper arm level, right arm, initial encounter: Secondary | ICD-10-CM | POA: Diagnosis not present

## 2014-12-30 DIAGNOSIS — S0083XA Contusion of other part of head, initial encounter: Secondary | ICD-10-CM | POA: Diagnosis not present

## 2014-12-30 DIAGNOSIS — S0990XA Unspecified injury of head, initial encounter: Secondary | ICD-10-CM | POA: Diagnosis not present

## 2014-12-30 DIAGNOSIS — T7411XA Adult physical abuse, confirmed, initial encounter: Secondary | ICD-10-CM | POA: Diagnosis not present

## 2015-05-17 DIAGNOSIS — J111 Influenza due to unidentified influenza virus with other respiratory manifestations: Secondary | ICD-10-CM | POA: Diagnosis not present

## 2015-05-31 DIAGNOSIS — J0100 Acute maxillary sinusitis, unspecified: Secondary | ICD-10-CM | POA: Diagnosis not present

## 2015-09-13 DIAGNOSIS — Z13220 Encounter for screening for lipoid disorders: Secondary | ICD-10-CM | POA: Diagnosis not present

## 2015-09-13 DIAGNOSIS — Z6841 Body Mass Index (BMI) 40.0 and over, adult: Secondary | ICD-10-CM | POA: Diagnosis not present

## 2015-09-13 DIAGNOSIS — R600 Localized edema: Secondary | ICD-10-CM | POA: Diagnosis not present

## 2015-09-13 DIAGNOSIS — L723 Sebaceous cyst: Secondary | ICD-10-CM | POA: Diagnosis not present

## 2015-09-13 DIAGNOSIS — R5383 Other fatigue: Secondary | ICD-10-CM | POA: Diagnosis not present

## 2015-09-13 DIAGNOSIS — E559 Vitamin D deficiency, unspecified: Secondary | ICD-10-CM | POA: Diagnosis not present

## 2015-09-13 DIAGNOSIS — J309 Allergic rhinitis, unspecified: Secondary | ICD-10-CM | POA: Diagnosis not present

## 2015-09-13 DIAGNOSIS — Z79899 Other long term (current) drug therapy: Secondary | ICD-10-CM | POA: Diagnosis not present

## 2015-09-13 DIAGNOSIS — F320 Major depressive disorder, single episode, mild: Secondary | ICD-10-CM | POA: Diagnosis not present

## 2015-09-29 DIAGNOSIS — Z9889 Other specified postprocedural states: Secondary | ICD-10-CM | POA: Diagnosis not present

## 2015-09-29 DIAGNOSIS — Z79899 Other long term (current) drug therapy: Secondary | ICD-10-CM | POA: Diagnosis not present

## 2015-09-29 DIAGNOSIS — C3402 Malignant neoplasm of left main bronchus: Secondary | ICD-10-CM | POA: Diagnosis not present

## 2015-09-29 DIAGNOSIS — J45909 Unspecified asthma, uncomplicated: Secondary | ICD-10-CM | POA: Diagnosis not present

## 2015-10-04 DIAGNOSIS — Z85118 Personal history of other malignant neoplasm of bronchus and lung: Secondary | ICD-10-CM | POA: Diagnosis not present

## 2015-10-04 DIAGNOSIS — D72829 Elevated white blood cell count, unspecified: Secondary | ICD-10-CM | POA: Diagnosis not present

## 2015-10-04 DIAGNOSIS — Z6839 Body mass index (BMI) 39.0-39.9, adult: Secondary | ICD-10-CM | POA: Diagnosis not present

## 2015-10-04 DIAGNOSIS — E559 Vitamin D deficiency, unspecified: Secondary | ICD-10-CM | POA: Diagnosis not present

## 2015-10-04 DIAGNOSIS — N760 Acute vaginitis: Secondary | ICD-10-CM | POA: Diagnosis not present

## 2015-11-15 DIAGNOSIS — L723 Sebaceous cyst: Secondary | ICD-10-CM | POA: Diagnosis not present

## 2015-11-22 DIAGNOSIS — L720 Epidermal cyst: Secondary | ICD-10-CM | POA: Diagnosis not present

## 2015-11-22 DIAGNOSIS — L723 Sebaceous cyst: Secondary | ICD-10-CM | POA: Diagnosis not present

## 2016-01-05 DIAGNOSIS — J0100 Acute maxillary sinusitis, unspecified: Secondary | ICD-10-CM | POA: Diagnosis not present

## 2016-01-05 DIAGNOSIS — J209 Acute bronchitis, unspecified: Secondary | ICD-10-CM | POA: Diagnosis not present

## 2016-01-24 DIAGNOSIS — Z124 Encounter for screening for malignant neoplasm of cervix: Secondary | ICD-10-CM | POA: Diagnosis not present

## 2016-01-24 DIAGNOSIS — Z7721 Contact with and (suspected) exposure to potentially hazardous body fluids: Secondary | ICD-10-CM | POA: Diagnosis not present

## 2016-01-24 DIAGNOSIS — Z23 Encounter for immunization: Secondary | ICD-10-CM | POA: Diagnosis not present

## 2016-01-24 DIAGNOSIS — Z1389 Encounter for screening for other disorder: Secondary | ICD-10-CM | POA: Diagnosis not present

## 2016-01-24 DIAGNOSIS — Z0000 Encounter for general adult medical examination without abnormal findings: Secondary | ICD-10-CM | POA: Diagnosis not present

## 2016-01-24 DIAGNOSIS — Z6841 Body Mass Index (BMI) 40.0 and over, adult: Secondary | ICD-10-CM | POA: Diagnosis not present

## 2016-01-24 DIAGNOSIS — N760 Acute vaginitis: Secondary | ICD-10-CM | POA: Diagnosis not present

## 2016-01-31 DIAGNOSIS — N760 Acute vaginitis: Secondary | ICD-10-CM | POA: Diagnosis not present

## 2016-01-31 DIAGNOSIS — Z6841 Body Mass Index (BMI) 40.0 and over, adult: Secondary | ICD-10-CM | POA: Diagnosis not present

## 2016-03-14 DIAGNOSIS — S199XXA Unspecified injury of neck, initial encounter: Secondary | ICD-10-CM | POA: Diagnosis not present

## 2016-03-14 DIAGNOSIS — T148XXA Other injury of unspecified body region, initial encounter: Secondary | ICD-10-CM | POA: Diagnosis not present

## 2016-03-14 DIAGNOSIS — Z79899 Other long term (current) drug therapy: Secondary | ICD-10-CM | POA: Diagnosis not present

## 2016-03-14 DIAGNOSIS — Z85118 Personal history of other malignant neoplasm of bronchus and lung: Secondary | ICD-10-CM | POA: Diagnosis not present

## 2016-03-14 DIAGNOSIS — N390 Urinary tract infection, site not specified: Secondary | ICD-10-CM | POA: Diagnosis not present

## 2016-03-14 DIAGNOSIS — M545 Low back pain: Secondary | ICD-10-CM | POA: Diagnosis not present

## 2016-03-14 DIAGNOSIS — M25512 Pain in left shoulder: Secondary | ICD-10-CM | POA: Diagnosis not present

## 2016-03-14 DIAGNOSIS — S134XXA Sprain of ligaments of cervical spine, initial encounter: Secondary | ICD-10-CM | POA: Diagnosis not present

## 2016-03-14 DIAGNOSIS — J90 Pleural effusion, not elsewhere classified: Secondary | ICD-10-CM | POA: Diagnosis not present

## 2016-03-22 DIAGNOSIS — Z85118 Personal history of other malignant neoplasm of bronchus and lung: Secondary | ICD-10-CM | POA: Diagnosis not present

## 2016-03-22 DIAGNOSIS — Z902 Acquired absence of lung [part of]: Secondary | ICD-10-CM | POA: Diagnosis not present

## 2016-03-22 DIAGNOSIS — R079 Chest pain, unspecified: Secondary | ICD-10-CM | POA: Diagnosis not present

## 2016-03-25 DIAGNOSIS — Z8249 Family history of ischemic heart disease and other diseases of the circulatory system: Secondary | ICD-10-CM | POA: Diagnosis not present

## 2016-03-25 DIAGNOSIS — R079 Chest pain, unspecified: Secondary | ICD-10-CM | POA: Diagnosis not present

## 2016-03-25 DIAGNOSIS — K224 Dyskinesia of esophagus: Secondary | ICD-10-CM | POA: Diagnosis not present

## 2016-03-25 DIAGNOSIS — J45909 Unspecified asthma, uncomplicated: Secondary | ICD-10-CM | POA: Diagnosis present

## 2016-03-25 DIAGNOSIS — R002 Palpitations: Secondary | ICD-10-CM | POA: Diagnosis present

## 2016-03-25 DIAGNOSIS — Z902 Acquired absence of lung [part of]: Secondary | ICD-10-CM | POA: Diagnosis not present

## 2016-03-25 DIAGNOSIS — K219 Gastro-esophageal reflux disease without esophagitis: Secondary | ICD-10-CM | POA: Diagnosis present

## 2016-03-25 DIAGNOSIS — Z85118 Personal history of other malignant neoplasm of bronchus and lung: Secondary | ICD-10-CM | POA: Diagnosis not present

## 2016-03-25 DIAGNOSIS — R0602 Shortness of breath: Secondary | ICD-10-CM | POA: Diagnosis present

## 2016-03-25 DIAGNOSIS — I361 Nonrheumatic tricuspid (valve) insufficiency: Secondary | ICD-10-CM | POA: Diagnosis not present

## 2016-04-04 DIAGNOSIS — Z79899 Other long term (current) drug therapy: Secondary | ICD-10-CM | POA: Diagnosis not present

## 2016-04-04 DIAGNOSIS — Z6841 Body Mass Index (BMI) 40.0 and over, adult: Secondary | ICD-10-CM | POA: Diagnosis not present

## 2016-04-04 DIAGNOSIS — R5383 Other fatigue: Secondary | ICD-10-CM | POA: Diagnosis not present

## 2016-04-04 DIAGNOSIS — Z8249 Family history of ischemic heart disease and other diseases of the circulatory system: Secondary | ICD-10-CM | POA: Diagnosis not present

## 2016-04-04 DIAGNOSIS — Z833 Family history of diabetes mellitus: Secondary | ICD-10-CM | POA: Diagnosis not present

## 2016-04-04 DIAGNOSIS — R079 Chest pain, unspecified: Secondary | ICD-10-CM | POA: Diagnosis not present

## 2016-04-04 DIAGNOSIS — R0602 Shortness of breath: Secondary | ICD-10-CM | POA: Diagnosis not present

## 2016-04-04 DIAGNOSIS — R635 Abnormal weight gain: Secondary | ICD-10-CM | POA: Diagnosis not present

## 2016-04-23 DIAGNOSIS — R079 Chest pain, unspecified: Secondary | ICD-10-CM | POA: Diagnosis not present

## 2016-04-23 DIAGNOSIS — K219 Gastro-esophageal reflux disease without esophagitis: Secondary | ICD-10-CM | POA: Diagnosis not present

## 2016-04-23 DIAGNOSIS — R110 Nausea: Secondary | ICD-10-CM | POA: Diagnosis not present

## 2016-04-23 DIAGNOSIS — R12 Heartburn: Secondary | ICD-10-CM | POA: Diagnosis not present

## 2016-05-14 DIAGNOSIS — N390 Urinary tract infection, site not specified: Secondary | ICD-10-CM | POA: Diagnosis not present

## 2016-05-14 DIAGNOSIS — R350 Frequency of micturition: Secondary | ICD-10-CM | POA: Diagnosis not present

## 2016-05-14 DIAGNOSIS — Z6841 Body Mass Index (BMI) 40.0 and over, adult: Secondary | ICD-10-CM | POA: Diagnosis not present

## 2016-05-15 DIAGNOSIS — R350 Frequency of micturition: Secondary | ICD-10-CM | POA: Diagnosis not present

## 2016-08-09 DIAGNOSIS — Z0000 Encounter for general adult medical examination without abnormal findings: Secondary | ICD-10-CM | POA: Diagnosis not present

## 2016-08-09 DIAGNOSIS — Z9181 History of falling: Secondary | ICD-10-CM | POA: Diagnosis not present

## 2016-08-09 DIAGNOSIS — Z1389 Encounter for screening for other disorder: Secondary | ICD-10-CM | POA: Diagnosis not present

## 2016-08-09 DIAGNOSIS — E669 Obesity, unspecified: Secondary | ICD-10-CM | POA: Diagnosis not present

## 2016-08-09 DIAGNOSIS — Z136 Encounter for screening for cardiovascular disorders: Secondary | ICD-10-CM | POA: Diagnosis not present

## 2016-08-20 DIAGNOSIS — Z6841 Body Mass Index (BMI) 40.0 and over, adult: Secondary | ICD-10-CM | POA: Diagnosis not present

## 2016-08-20 DIAGNOSIS — H68003 Unspecified Eustachian salpingitis, bilateral: Secondary | ICD-10-CM | POA: Diagnosis not present

## 2016-08-20 DIAGNOSIS — R945 Abnormal results of liver function studies: Secondary | ICD-10-CM | POA: Diagnosis not present

## 2016-08-20 DIAGNOSIS — J309 Allergic rhinitis, unspecified: Secondary | ICD-10-CM | POA: Diagnosis not present

## 2016-08-20 DIAGNOSIS — N926 Irregular menstruation, unspecified: Secondary | ICD-10-CM | POA: Diagnosis not present

## 2016-08-20 DIAGNOSIS — M940 Chondrocostal junction syndrome [Tietze]: Secondary | ICD-10-CM | POA: Diagnosis not present

## 2016-08-20 DIAGNOSIS — R7989 Other specified abnormal findings of blood chemistry: Secondary | ICD-10-CM | POA: Diagnosis not present

## 2016-08-20 DIAGNOSIS — R42 Dizziness and giddiness: Secondary | ICD-10-CM | POA: Diagnosis not present

## 2016-08-20 DIAGNOSIS — Z79899 Other long term (current) drug therapy: Secondary | ICD-10-CM | POA: Diagnosis not present

## 2016-08-28 DIAGNOSIS — K760 Fatty (change of) liver, not elsewhere classified: Secondary | ICD-10-CM | POA: Diagnosis not present

## 2016-08-28 DIAGNOSIS — R945 Abnormal results of liver function studies: Secondary | ICD-10-CM | POA: Diagnosis not present

## 2016-09-06 DIAGNOSIS — S199XXA Unspecified injury of neck, initial encounter: Secondary | ICD-10-CM | POA: Diagnosis not present

## 2016-09-06 DIAGNOSIS — R079 Chest pain, unspecified: Secondary | ICD-10-CM | POA: Diagnosis not present

## 2016-09-06 DIAGNOSIS — M5489 Other dorsalgia: Secondary | ICD-10-CM | POA: Diagnosis not present

## 2016-09-06 DIAGNOSIS — R51 Headache: Secondary | ICD-10-CM | POA: Diagnosis not present

## 2016-09-06 DIAGNOSIS — S0990XA Unspecified injury of head, initial encounter: Secondary | ICD-10-CM | POA: Diagnosis not present

## 2016-09-06 DIAGNOSIS — M545 Low back pain: Secondary | ICD-10-CM | POA: Diagnosis not present

## 2016-09-06 DIAGNOSIS — M542 Cervicalgia: Secondary | ICD-10-CM | POA: Diagnosis not present

## 2016-09-06 DIAGNOSIS — M79604 Pain in right leg: Secondary | ICD-10-CM | POA: Diagnosis not present

## 2016-09-06 DIAGNOSIS — M25551 Pain in right hip: Secondary | ICD-10-CM | POA: Diagnosis not present

## 2016-09-06 DIAGNOSIS — R52 Pain, unspecified: Secondary | ICD-10-CM | POA: Diagnosis not present

## 2016-09-06 DIAGNOSIS — S299XXA Unspecified injury of thorax, initial encounter: Secondary | ICD-10-CM | POA: Diagnosis not present

## 2016-09-27 DIAGNOSIS — Z902 Acquired absence of lung [part of]: Secondary | ICD-10-CM | POA: Diagnosis not present

## 2016-09-27 DIAGNOSIS — Z85118 Personal history of other malignant neoplasm of bronchus and lung: Secondary | ICD-10-CM | POA: Diagnosis not present

## 2016-09-27 DIAGNOSIS — Z08 Encounter for follow-up examination after completed treatment for malignant neoplasm: Secondary | ICD-10-CM | POA: Diagnosis not present

## 2016-09-27 DIAGNOSIS — C3402 Malignant neoplasm of left main bronchus: Secondary | ICD-10-CM | POA: Diagnosis not present

## 2016-09-27 DIAGNOSIS — I313 Pericardial effusion (noninflammatory): Secondary | ICD-10-CM | POA: Diagnosis not present

## 2016-10-17 DIAGNOSIS — Z79899 Other long term (current) drug therapy: Secondary | ICD-10-CM | POA: Diagnosis not present

## 2016-10-17 DIAGNOSIS — K219 Gastro-esophageal reflux disease without esophagitis: Secondary | ICD-10-CM | POA: Diagnosis not present

## 2016-10-17 DIAGNOSIS — R079 Chest pain, unspecified: Secondary | ICD-10-CM | POA: Diagnosis not present

## 2017-02-20 DIAGNOSIS — H66009 Acute suppurative otitis media without spontaneous rupture of ear drum, unspecified ear: Secondary | ICD-10-CM | POA: Diagnosis not present

## 2017-02-24 DIAGNOSIS — H6990 Unspecified Eustachian tube disorder, unspecified ear: Secondary | ICD-10-CM | POA: Diagnosis not present

## 2017-02-24 DIAGNOSIS — J0100 Acute maxillary sinusitis, unspecified: Secondary | ICD-10-CM | POA: Diagnosis not present

## 2017-02-24 DIAGNOSIS — J0110 Acute frontal sinusitis, unspecified: Secondary | ICD-10-CM | POA: Diagnosis not present

## 2017-02-25 DIAGNOSIS — R0789 Other chest pain: Secondary | ICD-10-CM | POA: Diagnosis not present

## 2017-02-25 DIAGNOSIS — Z79899 Other long term (current) drug therapy: Secondary | ICD-10-CM | POA: Diagnosis not present

## 2017-02-25 DIAGNOSIS — R079 Chest pain, unspecified: Secondary | ICD-10-CM

## 2017-02-25 DIAGNOSIS — R071 Chest pain on breathing: Secondary | ICD-10-CM | POA: Diagnosis not present

## 2017-02-25 DIAGNOSIS — Z7952 Long term (current) use of systemic steroids: Secondary | ICD-10-CM | POA: Diagnosis not present

## 2017-02-25 DIAGNOSIS — R0781 Pleurodynia: Secondary | ICD-10-CM | POA: Diagnosis not present

## 2017-02-25 DIAGNOSIS — I209 Angina pectoris, unspecified: Secondary | ICD-10-CM | POA: Diagnosis not present

## 2017-02-25 DIAGNOSIS — J45909 Unspecified asthma, uncomplicated: Secondary | ICD-10-CM | POA: Diagnosis not present

## 2017-02-25 DIAGNOSIS — G4733 Obstructive sleep apnea (adult) (pediatric): Secondary | ICD-10-CM | POA: Diagnosis not present

## 2017-02-26 DIAGNOSIS — R079 Chest pain, unspecified: Secondary | ICD-10-CM | POA: Diagnosis not present

## 2017-03-25 DIAGNOSIS — R945 Abnormal results of liver function studies: Secondary | ICD-10-CM | POA: Diagnosis not present

## 2017-03-25 DIAGNOSIS — Z87898 Personal history of other specified conditions: Secondary | ICD-10-CM | POA: Diagnosis not present

## 2017-03-25 DIAGNOSIS — E559 Vitamin D deficiency, unspecified: Secondary | ICD-10-CM | POA: Diagnosis not present

## 2017-03-25 DIAGNOSIS — Z23 Encounter for immunization: Secondary | ICD-10-CM | POA: Diagnosis not present

## 2017-03-25 DIAGNOSIS — Z6841 Body Mass Index (BMI) 40.0 and over, adult: Secondary | ICD-10-CM | POA: Diagnosis not present

## 2017-03-25 DIAGNOSIS — Z79899 Other long term (current) drug therapy: Secondary | ICD-10-CM | POA: Diagnosis not present

## 2017-04-21 DIAGNOSIS — J029 Acute pharyngitis, unspecified: Secondary | ICD-10-CM | POA: Diagnosis not present

## 2017-04-30 DIAGNOSIS — E785 Hyperlipidemia, unspecified: Secondary | ICD-10-CM | POA: Diagnosis not present

## 2017-04-30 DIAGNOSIS — R635 Abnormal weight gain: Secondary | ICD-10-CM | POA: Diagnosis not present

## 2017-04-30 DIAGNOSIS — R0602 Shortness of breath: Secondary | ICD-10-CM | POA: Diagnosis not present

## 2017-04-30 DIAGNOSIS — R5381 Other malaise: Secondary | ICD-10-CM | POA: Diagnosis not present

## 2017-04-30 DIAGNOSIS — Z6841 Body Mass Index (BMI) 40.0 and over, adult: Secondary | ICD-10-CM | POA: Diagnosis not present

## 2017-05-06 DIAGNOSIS — J301 Allergic rhinitis due to pollen: Secondary | ICD-10-CM | POA: Diagnosis not present

## 2017-05-06 DIAGNOSIS — R918 Other nonspecific abnormal finding of lung field: Secondary | ICD-10-CM | POA: Diagnosis not present

## 2017-05-06 DIAGNOSIS — G4733 Obstructive sleep apnea (adult) (pediatric): Secondary | ICD-10-CM | POA: Diagnosis not present

## 2017-05-06 DIAGNOSIS — G2581 Restless legs syndrome: Secondary | ICD-10-CM | POA: Diagnosis not present

## 2017-05-06 DIAGNOSIS — R5383 Other fatigue: Secondary | ICD-10-CM | POA: Diagnosis not present

## 2017-05-06 DIAGNOSIS — E559 Vitamin D deficiency, unspecified: Secondary | ICD-10-CM | POA: Diagnosis not present

## 2017-05-17 DIAGNOSIS — G4733 Obstructive sleep apnea (adult) (pediatric): Secondary | ICD-10-CM | POA: Diagnosis not present

## 2017-05-27 DIAGNOSIS — J301 Allergic rhinitis due to pollen: Secondary | ICD-10-CM | POA: Diagnosis not present

## 2017-05-27 DIAGNOSIS — R5383 Other fatigue: Secondary | ICD-10-CM | POA: Diagnosis not present

## 2017-05-27 DIAGNOSIS — G2581 Restless legs syndrome: Secondary | ICD-10-CM | POA: Diagnosis not present

## 2017-05-27 DIAGNOSIS — G4733 Obstructive sleep apnea (adult) (pediatric): Secondary | ICD-10-CM | POA: Diagnosis not present

## 2017-05-27 DIAGNOSIS — R918 Other nonspecific abnormal finding of lung field: Secondary | ICD-10-CM | POA: Diagnosis not present

## 2017-05-28 DIAGNOSIS — J020 Streptococcal pharyngitis: Secondary | ICD-10-CM | POA: Diagnosis not present

## 2017-06-03 DIAGNOSIS — Z6841 Body Mass Index (BMI) 40.0 and over, adult: Secondary | ICD-10-CM | POA: Diagnosis not present

## 2017-06-03 DIAGNOSIS — R635 Abnormal weight gain: Secondary | ICD-10-CM | POA: Diagnosis not present

## 2017-06-12 DIAGNOSIS — J0100 Acute maxillary sinusitis, unspecified: Secondary | ICD-10-CM | POA: Diagnosis not present

## 2017-06-12 DIAGNOSIS — J029 Acute pharyngitis, unspecified: Secondary | ICD-10-CM | POA: Diagnosis not present

## 2017-06-17 DIAGNOSIS — Z6841 Body Mass Index (BMI) 40.0 and over, adult: Secondary | ICD-10-CM | POA: Diagnosis not present

## 2017-06-17 DIAGNOSIS — R945 Abnormal results of liver function studies: Secondary | ICD-10-CM | POA: Diagnosis not present

## 2017-06-17 DIAGNOSIS — J309 Allergic rhinitis, unspecified: Secondary | ICD-10-CM | POA: Diagnosis not present

## 2017-06-17 DIAGNOSIS — R42 Dizziness and giddiness: Secondary | ICD-10-CM | POA: Diagnosis not present

## 2017-06-21 DIAGNOSIS — G4733 Obstructive sleep apnea (adult) (pediatric): Secondary | ICD-10-CM | POA: Diagnosis not present

## 2017-06-25 DIAGNOSIS — G4733 Obstructive sleep apnea (adult) (pediatric): Secondary | ICD-10-CM | POA: Diagnosis not present

## 2017-06-25 DIAGNOSIS — J301 Allergic rhinitis due to pollen: Secondary | ICD-10-CM | POA: Diagnosis not present

## 2017-06-25 DIAGNOSIS — R5383 Other fatigue: Secondary | ICD-10-CM | POA: Diagnosis not present

## 2017-06-25 DIAGNOSIS — G2581 Restless legs syndrome: Secondary | ICD-10-CM | POA: Diagnosis not present

## 2017-07-01 DIAGNOSIS — Z85118 Personal history of other malignant neoplasm of bronchus and lung: Secondary | ICD-10-CM | POA: Diagnosis not present

## 2017-07-01 DIAGNOSIS — I10 Essential (primary) hypertension: Secondary | ICD-10-CM | POA: Diagnosis not present

## 2017-07-01 DIAGNOSIS — N926 Irregular menstruation, unspecified: Secondary | ICD-10-CM | POA: Diagnosis not present

## 2017-07-01 DIAGNOSIS — J309 Allergic rhinitis, unspecified: Secondary | ICD-10-CM | POA: Diagnosis not present

## 2017-07-01 DIAGNOSIS — G4730 Sleep apnea, unspecified: Secondary | ICD-10-CM | POA: Diagnosis not present

## 2017-07-01 DIAGNOSIS — Z6841 Body Mass Index (BMI) 40.0 and over, adult: Secondary | ICD-10-CM | POA: Diagnosis not present

## 2017-07-01 DIAGNOSIS — R945 Abnormal results of liver function studies: Secondary | ICD-10-CM | POA: Diagnosis not present

## 2017-07-01 DIAGNOSIS — E559 Vitamin D deficiency, unspecified: Secondary | ICD-10-CM | POA: Diagnosis not present

## 2017-07-02 DIAGNOSIS — Z6841 Body Mass Index (BMI) 40.0 and over, adult: Secondary | ICD-10-CM | POA: Diagnosis not present

## 2017-07-02 DIAGNOSIS — R635 Abnormal weight gain: Secondary | ICD-10-CM | POA: Diagnosis not present

## 2017-07-03 DIAGNOSIS — G43909 Migraine, unspecified, not intractable, without status migrainosus: Secondary | ICD-10-CM | POA: Diagnosis not present

## 2017-08-01 DIAGNOSIS — K625 Hemorrhage of anus and rectum: Secondary | ICD-10-CM | POA: Diagnosis not present

## 2017-08-01 DIAGNOSIS — K5900 Constipation, unspecified: Secondary | ICD-10-CM | POA: Diagnosis not present

## 2017-08-01 DIAGNOSIS — Z6841 Body Mass Index (BMI) 40.0 and over, adult: Secondary | ICD-10-CM | POA: Diagnosis not present

## 2017-08-01 DIAGNOSIS — K219 Gastro-esophageal reflux disease without esophagitis: Secondary | ICD-10-CM | POA: Diagnosis not present

## 2017-08-01 DIAGNOSIS — R945 Abnormal results of liver function studies: Secondary | ICD-10-CM | POA: Diagnosis not present

## 2017-08-01 DIAGNOSIS — R635 Abnormal weight gain: Secondary | ICD-10-CM | POA: Diagnosis not present

## 2017-08-18 DIAGNOSIS — G4733 Obstructive sleep apnea (adult) (pediatric): Secondary | ICD-10-CM | POA: Diagnosis not present

## 2017-08-19 DIAGNOSIS — Z0000 Encounter for general adult medical examination without abnormal findings: Secondary | ICD-10-CM | POA: Diagnosis not present

## 2017-08-19 DIAGNOSIS — Z6839 Body mass index (BMI) 39.0-39.9, adult: Secondary | ICD-10-CM | POA: Diagnosis not present

## 2017-08-19 DIAGNOSIS — F411 Generalized anxiety disorder: Secondary | ICD-10-CM | POA: Diagnosis not present

## 2017-08-19 DIAGNOSIS — Z1331 Encounter for screening for depression: Secondary | ICD-10-CM | POA: Diagnosis not present

## 2017-08-19 DIAGNOSIS — G2581 Restless legs syndrome: Secondary | ICD-10-CM | POA: Diagnosis not present

## 2017-08-19 DIAGNOSIS — J4530 Mild persistent asthma, uncomplicated: Secondary | ICD-10-CM | POA: Diagnosis not present

## 2017-08-19 DIAGNOSIS — E669 Obesity, unspecified: Secondary | ICD-10-CM | POA: Diagnosis not present

## 2017-08-19 DIAGNOSIS — J301 Allergic rhinitis due to pollen: Secondary | ICD-10-CM | POA: Diagnosis not present

## 2017-08-19 DIAGNOSIS — G4733 Obstructive sleep apnea (adult) (pediatric): Secondary | ICD-10-CM | POA: Diagnosis not present

## 2017-08-29 DIAGNOSIS — R635 Abnormal weight gain: Secondary | ICD-10-CM | POA: Diagnosis not present

## 2017-08-29 DIAGNOSIS — Z6839 Body mass index (BMI) 39.0-39.9, adult: Secondary | ICD-10-CM | POA: Diagnosis not present

## 2017-09-17 DIAGNOSIS — Z902 Acquired absence of lung [part of]: Secondary | ICD-10-CM | POA: Diagnosis not present

## 2017-09-17 DIAGNOSIS — K921 Melena: Secondary | ICD-10-CM | POA: Diagnosis not present

## 2017-09-17 DIAGNOSIS — K6289 Other specified diseases of anus and rectum: Secondary | ICD-10-CM | POA: Diagnosis not present

## 2017-09-17 DIAGNOSIS — K640 First degree hemorrhoids: Secondary | ICD-10-CM | POA: Diagnosis not present

## 2017-09-17 DIAGNOSIS — K219 Gastro-esophageal reflux disease without esophagitis: Secondary | ICD-10-CM | POA: Diagnosis not present

## 2017-09-17 DIAGNOSIS — G4733 Obstructive sleep apnea (adult) (pediatric): Secondary | ICD-10-CM | POA: Diagnosis not present

## 2017-09-17 DIAGNOSIS — K644 Residual hemorrhoidal skin tags: Secondary | ICD-10-CM | POA: Diagnosis not present

## 2017-09-17 DIAGNOSIS — J45909 Unspecified asthma, uncomplicated: Secondary | ICD-10-CM | POA: Diagnosis not present

## 2017-09-17 DIAGNOSIS — Z85118 Personal history of other malignant neoplasm of bronchus and lung: Secondary | ICD-10-CM | POA: Diagnosis not present

## 2017-09-17 DIAGNOSIS — R194 Change in bowel habit: Secondary | ICD-10-CM | POA: Diagnosis not present

## 2017-09-17 DIAGNOSIS — K5900 Constipation, unspecified: Secondary | ICD-10-CM | POA: Diagnosis not present

## 2017-09-26 DIAGNOSIS — C3400 Malignant neoplasm of unspecified main bronchus: Secondary | ICD-10-CM | POA: Diagnosis not present

## 2017-09-26 DIAGNOSIS — G4730 Sleep apnea, unspecified: Secondary | ICD-10-CM | POA: Diagnosis not present

## 2017-09-26 DIAGNOSIS — Z85118 Personal history of other malignant neoplasm of bronchus and lung: Secondary | ICD-10-CM | POA: Diagnosis not present

## 2017-09-26 DIAGNOSIS — Z902 Acquired absence of lung [part of]: Secondary | ICD-10-CM | POA: Diagnosis not present

## 2017-09-26 DIAGNOSIS — Z08 Encounter for follow-up examination after completed treatment for malignant neoplasm: Secondary | ICD-10-CM | POA: Diagnosis not present

## 2017-09-26 DIAGNOSIS — Z9989 Dependence on other enabling machines and devices: Secondary | ICD-10-CM | POA: Diagnosis not present

## 2017-09-26 DIAGNOSIS — C3432 Malignant neoplasm of lower lobe, left bronchus or lung: Secondary | ICD-10-CM | POA: Diagnosis not present

## 2017-10-02 DIAGNOSIS — E669 Obesity, unspecified: Secondary | ICD-10-CM | POA: Diagnosis not present

## 2017-10-02 DIAGNOSIS — I10 Essential (primary) hypertension: Secondary | ICD-10-CM | POA: Diagnosis not present

## 2017-10-02 DIAGNOSIS — E559 Vitamin D deficiency, unspecified: Secondary | ICD-10-CM | POA: Diagnosis not present

## 2017-10-02 DIAGNOSIS — E785 Hyperlipidemia, unspecified: Secondary | ICD-10-CM | POA: Diagnosis not present

## 2017-10-02 DIAGNOSIS — K625 Hemorrhage of anus and rectum: Secondary | ICD-10-CM | POA: Diagnosis not present

## 2017-10-02 DIAGNOSIS — Z6839 Body mass index (BMI) 39.0-39.9, adult: Secondary | ICD-10-CM | POA: Diagnosis not present

## 2017-10-02 DIAGNOSIS — Z79899 Other long term (current) drug therapy: Secondary | ICD-10-CM | POA: Diagnosis not present

## 2017-10-13 DIAGNOSIS — S90861A Insect bite (nonvenomous), right foot, initial encounter: Secondary | ICD-10-CM | POA: Diagnosis not present

## 2017-10-13 DIAGNOSIS — T63441A Toxic effect of venom of bees, accidental (unintentional), initial encounter: Secondary | ICD-10-CM | POA: Diagnosis not present

## 2017-10-24 DIAGNOSIS — J040 Acute laryngitis: Secondary | ICD-10-CM | POA: Diagnosis not present

## 2017-10-24 DIAGNOSIS — H66009 Acute suppurative otitis media without spontaneous rupture of ear drum, unspecified ear: Secondary | ICD-10-CM | POA: Diagnosis not present

## 2017-11-07 DIAGNOSIS — G2581 Restless legs syndrome: Secondary | ICD-10-CM | POA: Diagnosis not present

## 2017-11-07 DIAGNOSIS — F411 Generalized anxiety disorder: Secondary | ICD-10-CM | POA: Diagnosis not present

## 2017-11-07 DIAGNOSIS — J301 Allergic rhinitis due to pollen: Secondary | ICD-10-CM | POA: Diagnosis not present

## 2017-11-07 DIAGNOSIS — J4530 Mild persistent asthma, uncomplicated: Secondary | ICD-10-CM | POA: Diagnosis not present

## 2017-11-07 DIAGNOSIS — G4733 Obstructive sleep apnea (adult) (pediatric): Secondary | ICD-10-CM | POA: Diagnosis not present

## 2018-01-07 DIAGNOSIS — J301 Allergic rhinitis due to pollen: Secondary | ICD-10-CM | POA: Diagnosis not present

## 2018-01-07 DIAGNOSIS — F411 Generalized anxiety disorder: Secondary | ICD-10-CM | POA: Diagnosis not present

## 2018-01-07 DIAGNOSIS — J4530 Mild persistent asthma, uncomplicated: Secondary | ICD-10-CM | POA: Diagnosis not present

## 2018-01-07 DIAGNOSIS — G4733 Obstructive sleep apnea (adult) (pediatric): Secondary | ICD-10-CM | POA: Diagnosis not present

## 2018-01-07 DIAGNOSIS — G2581 Restless legs syndrome: Secondary | ICD-10-CM | POA: Diagnosis not present

## 2018-01-13 DIAGNOSIS — G4733 Obstructive sleep apnea (adult) (pediatric): Secondary | ICD-10-CM | POA: Diagnosis not present

## 2018-05-13 DIAGNOSIS — R05 Cough: Secondary | ICD-10-CM | POA: Diagnosis not present

## 2023-06-26 ENCOUNTER — Other Ambulatory Visit: Payer: Self-pay | Admitting: Orthopedic Surgery

## 2023-06-26 DIAGNOSIS — M4726 Other spondylosis with radiculopathy, lumbar region: Secondary | ICD-10-CM

## 2023-06-26 DIAGNOSIS — M5431 Sciatica, right side: Secondary | ICD-10-CM

## 2023-07-12 ENCOUNTER — Ambulatory Visit
Admission: RE | Admit: 2023-07-12 | Discharge: 2023-07-12 | Disposition: A | Discharge status: Home / Self Care | Source: Ambulatory Visit | Attending: Orthopedic Surgery | Admitting: Orthopedic Surgery

## 2023-07-12 DIAGNOSIS — M4726 Other spondylosis with radiculopathy, lumbar region: Secondary | ICD-10-CM

## 2023-07-12 DIAGNOSIS — M5432 Sciatica, left side: Secondary | ICD-10-CM
